# Patient Record
Sex: Male | Born: 1935 | Race: White | Hispanic: No | Smoking: Never smoker
Health system: Southern US, Community
[De-identification: ages and names within clinical notes are randomized; demographics above are authoritative.]

## PROBLEM LIST (undated history)

## (undated) DIAGNOSIS — C801 Malignant (primary) neoplasm, unspecified: Secondary | ICD-10-CM

## (undated) DIAGNOSIS — E7800 Pure hypercholesterolemia, unspecified: Secondary | ICD-10-CM

## (undated) DIAGNOSIS — E079 Disorder of thyroid, unspecified: Secondary | ICD-10-CM

## (undated) DIAGNOSIS — K449 Diaphragmatic hernia without obstruction or gangrene: Secondary | ICD-10-CM

## (undated) DIAGNOSIS — K219 Gastro-esophageal reflux disease without esophagitis: Secondary | ICD-10-CM

## (undated) DIAGNOSIS — K922 Gastrointestinal hemorrhage, unspecified: Secondary | ICD-10-CM

## (undated) DIAGNOSIS — M810 Age-related osteoporosis without current pathological fracture: Secondary | ICD-10-CM

## (undated) HISTORY — PX: TONSILLECTOMY: SUR1361

## (undated) HISTORY — PX: APPENDECTOMY: SHX54

---

## 2000-01-22 ENCOUNTER — Other Ambulatory Visit: Admission: RE | Admit: 2000-01-22 | Discharge: 2000-01-22 | Payer: Self-pay | Admitting: Urology

## 2000-02-21 ENCOUNTER — Encounter: Admission: RE | Admit: 2000-02-21 | Discharge: 2000-05-21 | Payer: Self-pay | Admitting: Radiation Oncology

## 2000-03-13 ENCOUNTER — Ambulatory Visit (HOSPITAL_COMMUNITY): Admission: RE | Admit: 2000-03-13 | Discharge: 2000-03-13 | Payer: Self-pay | Admitting: Gastroenterology

## 2001-07-03 ENCOUNTER — Ambulatory Visit (HOSPITAL_COMMUNITY): Admission: RE | Admit: 2001-07-03 | Discharge: 2001-07-03 | Payer: Self-pay | Admitting: Gastroenterology

## 2006-02-08 ENCOUNTER — Emergency Department (HOSPITAL_COMMUNITY): Admission: EM | Admit: 2006-02-08 | Discharge: 2006-02-08 | Payer: Self-pay | Admitting: Family Medicine

## 2006-12-06 ENCOUNTER — Encounter: Admission: RE | Admit: 2006-12-06 | Discharge: 2006-12-06 | Payer: Self-pay | Admitting: Family Medicine

## 2007-07-31 ENCOUNTER — Encounter (INDEPENDENT_AMBULATORY_CARE_PROVIDER_SITE_OTHER): Payer: Self-pay | Admitting: Family Medicine

## 2007-07-31 ENCOUNTER — Ambulatory Visit (HOSPITAL_COMMUNITY): Admission: RE | Admit: 2007-07-31 | Discharge: 2007-07-31 | Payer: Self-pay | Admitting: Family Medicine

## 2007-07-31 ENCOUNTER — Ambulatory Visit: Payer: Self-pay | Admitting: Vascular Surgery

## 2010-06-30 ENCOUNTER — Inpatient Hospital Stay (HOSPITAL_COMMUNITY): Admission: EM | Admit: 2010-06-30 | Discharge: 2010-07-03 | Payer: Self-pay | Admitting: Emergency Medicine

## 2011-01-11 LAB — COMPREHENSIVE METABOLIC PANEL
ALT: 14 U/L (ref 0–53)
AST: 21 U/L (ref 0–37)
CO2: 25 mEq/L (ref 19–32)
Calcium: 8.9 mg/dL (ref 8.4–10.5)
Chloride: 110 mEq/L (ref 96–112)
GFR calc non Af Amer: >60 mL/min (ref >60)
Glucose, Bld: 112 mg/dL — ABNORMAL HIGH (ref 70–99)
Sodium: 140 mEq/L (ref 135–145)
Total Bilirubin: 0.9 mg/dL (ref 0.3–1.2)

## 2011-01-11 LAB — CBC
HCT: 33.1 % — ABNORMAL LOW (ref 39.0–52.0)
HCT: 34.5 % — ABNORMAL LOW (ref 39.0–52.0)
HCT: 36 % — ABNORMAL LOW (ref 39.0–52.0)
HCT: 36.5 % — ABNORMAL LOW (ref 39.0–52.0)
HCT: 38 % — ABNORMAL LOW (ref 39.0–52.0)
HCT: 40.9 % (ref 39.0–52.0)
Hemoglobin: 11.4 g/dL — ABNORMAL LOW (ref 13.0–17.0)
Hemoglobin: 11.7 g/dL — ABNORMAL LOW (ref 13.0–17.0)
Hemoglobin: 11.9 g/dL — ABNORMAL LOW (ref 13.0–17.0)
Hemoglobin: 12.4 g/dL — ABNORMAL LOW (ref 13.0–17.0)
Hemoglobin: 12.5 g/dL — ABNORMAL LOW (ref 13.0–17.0)
Hemoglobin: 14.1 g/dL (ref 13.0–17.0)
MCH: 32.8 pg (ref 26.0–34.0)
MCH: 32.8 pg (ref 26.0–34.0)
MCH: 32.9 pg (ref 26.0–34.0)
MCH: 32.9 pg (ref 26.0–34.0)
MCH: 32.9 pg (ref 26.0–34.0)
MCH: 33 pg (ref 26.0–34.0)
MCH: 33.1 pg (ref 26.0–34.0)
MCHC: 34 g/dL (ref 30.0–36.0)
MCHC: 34.3 g/dL (ref 30.0–36.0)
MCHC: 34.3 g/dL (ref 30.0–36.0)
MCHC: 34.5 g/dL (ref 30.0–36.0)
MCV: 95.4 fL (ref 78.0–100.0)
MCV: 95.8 fL (ref 78.0–100.0)
MCV: 96 fL (ref 78.0–100.0)
MCV: 96.6 fL (ref 78.0–100.0)
Platelets: 201 10*3/uL (ref 150–400)
Platelets: 204 10*3/uL (ref 150–400)
Platelets: 205 10*3/uL (ref 150–400)
Platelets: 213 10*3/uL (ref 150–400)
Platelets: 216 10*3/uL (ref 150–400)
Platelets: 217 10*3/uL (ref 150–400)
RBC: 3.44 MIL/uL — ABNORMAL LOW (ref 4.22–5.81)
RBC: 3.55 MIL/uL — ABNORMAL LOW (ref 4.22–5.81)
RBC: 3.57 MIL/uL — ABNORMAL LOW (ref 4.22–5.81)
RBC: 3.76 MIL/uL — ABNORMAL LOW (ref 4.22–5.81)
RBC: 3.77 MIL/uL — ABNORMAL LOW (ref 4.22–5.81)
RBC: 4.28 MIL/uL (ref 4.22–5.81)
RDW: 12.6 % (ref 11.5–15.5)
RDW: 12.7 % (ref 11.5–15.5)
RDW: 12.7 % (ref 11.5–15.5)
RDW: 12.8 % (ref 11.5–15.5)
RDW: 12.8 % (ref 11.5–15.5)
RDW: 12.8 % (ref 11.5–15.5)
WBC: 6.6 10*3/uL (ref 4.0–10.5)
WBC: 6.7 10*3/uL (ref 4.0–10.5)
WBC: 7.4 10*3/uL (ref 4.0–10.5)
WBC: 8.5 10*3/uL (ref 4.0–10.5)
WBC: 8.8 10*3/uL (ref 4.0–10.5)

## 2011-01-11 LAB — DIFFERENTIAL
Basophils Absolute: 0 10*3/uL (ref 0.0–0.1)
Basophils Relative: 0 % (ref 0–1)
Eosinophils Absolute: 0 10*3/uL (ref 0.0–0.7)
Eosinophils Relative: 0 % (ref 0–5)
Lymphs Abs: 0.8 10*3/uL (ref 0.7–4.0)
Neutrophils Relative %: 81 % — ABNORMAL HIGH (ref 43–77)

## 2011-01-11 LAB — URINALYSIS, ROUTINE W REFLEX MICROSCOPIC
Bilirubin Urine: NEGATIVE
Glucose, UA: NEGATIVE mg/dL
Hgb urine dipstick: NEGATIVE
Ketones, ur: NEGATIVE mg/dL
Nitrite: NEGATIVE
Protein, ur: NEGATIVE mg/dL
Specific Gravity, Urine: 1.012 (ref 1.005–1.030)
Urobilinogen, UA: 1 mg/dL (ref 0.0–1.0)
pH: 7 (ref 5.0–8.0)

## 2011-01-11 LAB — URINE CULTURE
Colony Count: NO GROWTH
Culture  Setup Time: 201109021548
Culture: NO GROWTH

## 2011-01-11 LAB — PROTIME-INR: Prothrombin Time: 13.8 seconds (ref 11.6–15.2)

## 2011-01-11 LAB — BASIC METABOLIC PANEL
BUN: 12 mg/dL (ref 6–23)
CO2: 26 mEq/L (ref 19–32)
Chloride: 110 mEq/L (ref 96–112)
Creatinine, Ser: 0.82 mg/dL (ref 0.4–1.5)
Potassium: 3.9 mEq/L (ref 3.5–5.1)

## 2011-01-11 LAB — LIPASE, BLOOD: Lipase: 22 U/L (ref 11–59)

## 2011-01-11 LAB — HEMOCCULT GUIAC POC 1CARD (OFFICE): Fecal Occult Bld: POSITIVE

## 2011-03-16 NOTE — Procedures (Signed)
[facility]  Patient:    Kevin Carr, Kevin Carr                      MRN: [id number] Proc. Date: 03/13/00 Adm. Date:  [id number] Disc. Date: [id number] Attending:  Nelda Marseille CC:         Petra Kuba, M.D.             Oley Balm Georgina Pillion, M.D.             Maryln Gottron, M.D.             Rozanna Boer., M.D.                           Procedure Report  PROCEDURE PERFORMED:  Colonoscopy.  ENDOSCOPIST:  Petra Kuba, M.D.  INDICATIONS FOR PROCEDURE:  Guaiac positivity in a patient with a recent diagnosis of prostate cancer about to begin radiation therapy.  Consent was signed after risks, benefits, methods, and options were thoroughly discussed.  MEDICATIONS USED:  Demerol 50 mg, Versed 4 mg.  DESCRIPTION OF PROCEDURE:  Rectal inspection was pertinent for small external hemorrhoids.  Digital exam was negative.  Video colonoscope was inserted and fairly easily advanced around the colon to the cecum.  This did require some abdominal pressure but no position changes.  On insertion, both left and right diverticula were seen but no other abnormalities.  The cecum was identified by the appendiceal orifice and ileocecal valve.  In fact the scope was inserted a short ways into the terminal ileum which was normal.  Photodocumentation was obtained and the scope was slowly withdrawn.  Prep was adequate.  There was minimal liquid stool that required suctioning only.  On slow withdrawal through the colon, other than the diverticula mentioned above, no abnormalities were seen.  No blood was seen.  Once back in the rectum, the scope was retroflexed pertinent for some small internal hemorrhoids.  Scope was straightened and readvanced a short ways up the sigmoid.  Air was suctioned and scope removed.  The patient tolerated the procedure well.  There was no obvious immediate complication.  ENDOSCOPIC DIAGNOSIS: 1. Internal and external hemorrhoids. 2.  Diverticula throughout. 3. No blood seen in exam to the terminal ileum.  Otherwise within normal    limits to the terminal ileum.  PLAN:  Follow-up p.r.n. or in six to eight weeks to recheck symptoms, guaiac to make sure a one time upper GI small bowel follow-through is not needed to complete his work-up.  Happy to see back sooner p.r.n. and will recommend repeat screening in five years. DD:  03/13/00 TD:  03/15/00 Job: [id number]

## 2012-10-27 ENCOUNTER — Encounter (HOSPITAL_COMMUNITY): Payer: Self-pay | Admitting: *Deleted

## 2012-10-27 ENCOUNTER — Emergency Department (HOSPITAL_COMMUNITY): Payer: Medicare Other

## 2012-10-27 ENCOUNTER — Ambulatory Visit (HOSPITAL_COMMUNITY): Payer: Medicare Other

## 2012-10-27 ENCOUNTER — Emergency Department (HOSPITAL_COMMUNITY)
Admission: EM | Admit: 2012-10-27 | Discharge: 2012-10-27 | Disposition: A | Discharge status: Home / Self Care | Payer: Medicare Other | Attending: Emergency Medicine | Admitting: Emergency Medicine

## 2012-10-27 DIAGNOSIS — R1110 Vomiting, unspecified: Secondary | ICD-10-CM | POA: Insufficient documentation

## 2012-10-27 DIAGNOSIS — Z8546 Personal history of malignant neoplasm of prostate: Secondary | ICD-10-CM | POA: Insufficient documentation

## 2012-10-27 DIAGNOSIS — N200 Calculus of kidney: Secondary | ICD-10-CM | POA: Insufficient documentation

## 2012-10-27 DIAGNOSIS — Z8719 Personal history of other diseases of the digestive system: Secondary | ICD-10-CM | POA: Insufficient documentation

## 2012-10-27 DIAGNOSIS — K5900 Constipation, unspecified: Secondary | ICD-10-CM | POA: Insufficient documentation

## 2012-10-27 HISTORY — DX: Malignant (primary) neoplasm, unspecified: C80.1

## 2012-10-27 HISTORY — DX: Gastrointestinal hemorrhage, unspecified: K92.2

## 2012-10-27 LAB — URINALYSIS, ROUTINE W REFLEX MICROSCOPIC
Bilirubin Urine: NEGATIVE
Glucose, UA: NEGATIVE mg/dL
Ketones, ur: NEGATIVE mg/dL
Nitrite: NEGATIVE
Protein, ur: NEGATIVE mg/dL
pH: 6 (ref 5.0–8.0)

## 2012-10-27 LAB — CBC WITH DIFFERENTIAL/PLATELET
Basophils Absolute: 0 10*3/uL (ref 0.0–0.1)
Eosinophils Relative: 0 % (ref 0–5)
HCT: 44.2 % (ref 39.0–52.0)
Lymphocytes Relative: 4 % — ABNORMAL LOW (ref 12–46)
Lymphs Abs: 0.6 10*3/uL — ABNORMAL LOW (ref 0.7–4.0)
MCH: 32.4 pg (ref 26.0–34.0)
MCV: 92.9 fL (ref 78.0–100.0)
Monocytes Absolute: 0.7 10*3/uL (ref 0.1–1.0)
RDW: 13 % (ref 11.5–15.5)
WBC: 13.9 10*3/uL — ABNORMAL HIGH (ref 4.0–10.5)

## 2012-10-27 LAB — COMPREHENSIVE METABOLIC PANEL
CO2: 24 mEq/L (ref 19–32)
Calcium: 9.5 mg/dL (ref 8.4–10.5)
Creatinine, Ser: 1.04 mg/dL (ref 0.50–1.35)
GFR calc Af Amer: 74 mL/min — ABNORMAL LOW (ref >90)
GFR calc non Af Amer: 64 mL/min — ABNORMAL LOW (ref >90)
Glucose, Bld: 126 mg/dL — ABNORMAL HIGH (ref 70–99)

## 2012-10-27 LAB — LIPASE, BLOOD: Lipase: 21 U/L (ref 11–59)

## 2012-10-27 MED ORDER — SODIUM CHLORIDE 0.9 % IV BOLUS (SEPSIS)
1000.0000 mL | Freq: Once | INTRAVENOUS | Status: AC
  Administered 2012-10-27: 1000 mL via INTRAVENOUS

## 2012-10-27 MED ORDER — TAMSULOSIN HCL 0.4 MG PO CAPS: 0.4000 mg | ORAL_CAPSULE | Freq: Every day | ORAL | Status: AC

## 2012-10-27 MED ORDER — IBUPROFEN 600 MG PO TABS: 600.0000 mg | ORAL_TABLET | Freq: Four times a day (QID) | ORAL | Status: DC | PRN

## 2012-10-27 MED ORDER — HYDROMORPHONE HCL PF 1 MG/ML IJ SOLN
0.5000 mg | Freq: Once | INTRAMUSCULAR | Status: DC
  Filled 2012-10-27: qty 1

## 2012-10-27 MED ORDER — ONDANSETRON HCL 4 MG/2ML IJ SOLN
4.0000 mg | Freq: Once | INTRAMUSCULAR | Status: AC
  Administered 2012-10-27: 4 mg via INTRAVENOUS

## 2012-10-27 MED ORDER — OXYCODONE-ACETAMINOPHEN 5-325 MG PO TABS: 2.0000 | ORAL_TABLET | ORAL | Status: DC | PRN

## 2012-10-27 MED ORDER — IOHEXOL 300 MG/ML  SOLN
100.0000 mL | Freq: Once | INTRAMUSCULAR | Status: AC | PRN
  Administered 2012-10-27: 100 mL via INTRAVENOUS

## 2012-10-27 MED ORDER — ONDANSETRON HCL 4 MG/2ML IJ SOLN
INTRAMUSCULAR | Status: AC
  Filled 2012-10-27: qty 2

## 2012-10-27 MED ORDER — MORPHINE SULFATE 4 MG/ML IJ SOLN
4.0000 mg | Freq: Once | INTRAMUSCULAR | Status: AC
  Administered 2012-10-27: 4 mg via INTRAVENOUS
  Filled 2012-10-27: qty 1

## 2012-10-27 MED ORDER — FLEET ENEMA 7-19 GM/118ML RE ENEM
1.0000 | ENEMA | Freq: Once | RECTAL | Status: AC
  Administered 2012-10-27: 1 via RECTAL
  Filled 2012-10-27: qty 1

## 2012-10-27 NOTE — ED Notes (Signed)
Went in to medicate pt as ordered for pain, pt states he feels much better.  Rates pain 2/10.  Dr. Silverio Lay notified

## 2012-10-27 NOTE — ED Notes (Signed)
Pt states since 3 am this morning he's been having lower abdominal pain, states been constipated, when asked when last BM was states "I forced myself to have a small one this morning but before then it's been a couple days". Pt states drank a coke this morning and after vomited x 2.

## 2012-10-27 NOTE — ED Notes (Signed)
Pt drinking PO contrast at this time.

## 2012-10-27 NOTE — ED Provider Notes (Signed)
History     CSN: [id number]  Arrival date & time 10/27/12  0272   First MD Initiated Contact with Patient 10/27/12 [phone]      Chief Complaint  Patient presents with  . Abdominal Pain  . Constipation    (Consider location/radiation/quality/duration/timing/severity/associated sxs/prior treatment) The history is provided by the patient.  Kevin Carr is a 78 y.o. male history of GI bleed here with constipation. Constipated for the last to 3 days. This morning woke up with some lower abdominal pain, that is crampy and nonradiating. He strained and had a small bowel movement this morning. He also drinks him Coca-Cola and felt better initially but then vomited x2. He had a history of appendectomy in the past but no history of obstruction.   Past Medical History  Diagnosis Date  . GI bleed   . Cancer     prostate    Past Surgical History  Procedure Date  . Tonsillectomy   . Appendectomy     History reviewed. No pertinent family history.  History  Substance Use Topics  . Smoking status: Never Smoker   . Smokeless tobacco: Never Used  . Alcohol Use: No      Review of Systems  Gastrointestinal: Positive for vomiting, abdominal pain and constipation.  All other systems reviewed and are negative.    Allergies  Review of patient's allergies indicates no known allergies.  Home Medications   Current Outpatient Rx  Name  Route  Sig  Dispense  Refill  . ASPIRIN 81 MG PO TABS   Oral   Take 81 mg by mouth daily.         Marland Kitchen FINASTERIDE 5 MG PO TABS   Oral   Take 5 mg by mouth daily.         Marland Kitchen LEVOTHYROXINE SODIUM 112 MCG PO TABS   Oral   Take 112 mcg by mouth daily.         . MULTI-VITAMIN/MINERALS PO TABS   Oral   Take 1 tablet by mouth daily.         Marland Kitchen OMEPRAZOLE 20 MG PO CPDR   Oral   Take 20 mg by mouth daily.         Marland Kitchen TAMSULOSIN HCL 0.4 MG PO CAPS   Oral   Take by mouth.           BP 157/60  Pulse 80  Temp 97.7 F (36.5 C) (Oral)   Resp 16  SpO2 96%  Physical Exam  Nursing note and vitals reviewed. Constitutional: He is oriented to person, place, and time. He appears well-developed and well-nourished.       Uncomfortable   HENT:  Head: Normocephalic.  Mouth/Throat: Oropharynx is clear and moist.  Eyes: Conjunctivae normal are normal. Pupils are equal, round, and reactive to light.  Neck: Normal range of motion. Neck supple.  Cardiovascular: Normal rate, regular rhythm and normal heart sounds.   Pulmonary/Chest: Effort normal and breath sounds normal. No respiratory distress. He has no wheezes. He has no rales.  Abdominal: Soft.       + distended, + mild diffuse tenderness, worse in LLQ. No rebound. + BS. No CVAT. Rectal- no hemorrhoids, brown stool, not impacted.   Musculoskeletal: Normal range of motion. He exhibits no edema and no tenderness.  Neurological: He is alert and oriented to person, place, and time.  Skin: Skin is warm and dry.  Psychiatric: He has a normal mood and affect. His behavior is normal. Judgment  and thought content normal.    ED Course  Procedures (including critical care time)  Labs Reviewed  CBC WITH DIFFERENTIAL - Abnormal; Notable for the following:    WBC 13.9 (*)     Neutrophils Relative 91 (*)     Neutro Abs 12.6 (*)     Lymphocytes Relative 4 (*)     Lymphs Abs 0.6 (*)     All other components within normal limits  COMPREHENSIVE METABOLIC PANEL - Abnormal; Notable for the following:    Glucose, Bld 126 (*)     GFR calc non Af Amer 64 (*)     GFR calc Af Amer 74 (*)     All other components within normal limits  LIPASE, BLOOD  URINALYSIS, ROUTINE W REFLEX MICROSCOPIC   Ct Abdomen Pelvis W Contrast  10/27/2012  *RADIOLOGY REPORT*  Clinical Data: Abdominal pain  CT ABDOMEN AND PELVIS WITH CONTRAST  Technique:  Multidetector CT imaging of the abdomen and pelvis was performed following the standard protocol during bolus administration of intravenous contrast.  Contrast:  OMNIPAQUE IOHEXOL 300 MG/ML  SOLN  Comparison: June 30, 2010  Findings: A large hiatal hernia contains most of the stomach. Contrast in an air-fluid level is present in the stomach.  But it has progressed into the jejunum.  No disproportionate dilatation of small or large bowel.  Additional imaging through the thorax was performed to delineate the hiatal hernia.  Coronary artery calcifications are present. Contrast is present throughout the esophagus. This may be due to reflux.  There is associated volume loss in the left lower lobe.  Liver contains calcified granulomata but no solid mass. Gallbladder, spleen, pancreas, adrenal glands are within normal limits.  There is a large simple cyst in the lower pole of the right kidney. Several smaller hypodensities are likely cysts.  The bladder is lobulated and distended.  6 mm calculus at the left ureteral vesicle junction.  There is dilatation of the left ureter but no delayed contrast excretion from the left kidney.  Prostate is normal in size.  No free fluid.  No obvious abnormal adenopathy.  Circumaortic left renal vein anatomy.  Extensive diverticulosis of the colon without evidence of acute diverticulitis.  L4 compression fracture with proximally 30% loss of height. Superior end plate depression is associated.  Age is indeterminate.  IMPRESSION: Large hiatal hernia.  No definite evidence of obstruction. Contrast in the esophagus may simply represent reflux.  6 mm distal left ureteral calculus with dilatation of the ureter. No delayed excretion of contrast from the left kidney.  Diverticulosis of the colon without evidence of diverticulitis.  L4 compression fracture of indeterminate age.   Original Report Authenticated By: Jolaine Click, M.D.    Dg Abd Acute W/chest  10/27/2012  *RADIOLOGY REPORT*  Clinical Data: Abdominal pain and constipation.  ACUTE ABDOMEN SERIES (ABDOMEN 2 VIEW & CHEST 1 VIEW)  Comparison: Abdomen CT from 06/30/2010  Findings: Upright  chest x-ray shows no edema or focal airspace consolidation. Interstitial markings are diffusely coarsened with chronic features. Cardiopericardial silhouette is at upper limits of normal for size.  Large hiatal hernia noted.  Upright film shows no evidence for intraperitoneal free air. Supine film shows no gaseous bowel dilatation to suggest obstruction.  Prominent stool noted in the right colon with average stool volume seen in the left and distal colon.  Bones are diffusely demineralized.  IMPRESSION: No acute cardiopulmonary findings.  Large hiatal hernia.  Prominent stool in the right colon without evidence  for bowel obstruction.   Original Report Authenticated By: Kennith Center, M.D.      No diagnosis found.    MDM  Kevin Carr is a 78 y.o. male here with constipation and ab pain. Ab pain likely from constipation but given hx of appendectomy, will get xray to r/o SBO.   10:20 AM  Xrays showed no SBO. Patient had enema with minimal improvement. Will put in IV and give pain meds and do CT ab/pel to r/o partial SBO.   2:11 PM Patient felt better after pain meds. CT showed large hiatal hernia without obstruction and 6 mm L UVJ stone. I think his pain is likely from kidney stone. Will d/c home with percocet, motrin, and flomax. He has a urologist that he can see.      Richardean Canal, MD 10/27/12 [phone]

## 2012-10-27 NOTE — ED Notes (Signed)
Pt reports lower abd pain early this am.  Pt reports LBM x 3 days ago but reports having a small one this am.

## 2012-10-27 NOTE — ED Notes (Signed)
Pt used BR after enema was administered-small BM noted.  Dr. Silverio Lay notified

## 2012-10-27 NOTE — ED Notes (Signed)
Pt ambulated to the BR with steady gait without assist 

## 2012-10-27 NOTE — ED Notes (Signed)
Patient transported to CT 

## 2015-08-16 ENCOUNTER — Other Ambulatory Visit: Payer: Self-pay | Admitting: Family Medicine

## 2015-08-16 ENCOUNTER — Ambulatory Visit
Admission: RE | Admit: 2015-08-16 | Discharge: 2015-08-16 | Disposition: A | Discharge status: Home / Self Care | Payer: Medicare Other | Source: Ambulatory Visit | Attending: Family Medicine | Admitting: Family Medicine

## 2015-08-16 DIAGNOSIS — T148XXA Other injury of unspecified body region, initial encounter: Secondary | ICD-10-CM

## 2015-08-16 DIAGNOSIS — M545 Low back pain: Secondary | ICD-10-CM

## 2015-08-22 ENCOUNTER — Other Ambulatory Visit: Payer: Self-pay | Admitting: Family Medicine

## 2015-08-22 DIAGNOSIS — S32000S Wedge compression fracture of unspecified lumbar vertebra, sequela: Secondary | ICD-10-CM

## 2015-09-21 ENCOUNTER — Ambulatory Visit
Admission: RE | Admit: 2015-09-21 | Discharge: 2015-09-21 | Disposition: A | Discharge status: Home / Self Care | Payer: Medicare Other | Source: Ambulatory Visit | Attending: Family Medicine | Admitting: Family Medicine

## 2015-09-21 DIAGNOSIS — S32000S Wedge compression fracture of unspecified lumbar vertebra, sequela: Secondary | ICD-10-CM

## 2015-10-18 ENCOUNTER — Other Ambulatory Visit: Payer: Self-pay | Admitting: Family Medicine

## 2015-10-18 ENCOUNTER — Ambulatory Visit
Admission: RE | Admit: 2015-10-18 | Discharge: 2015-10-18 | Disposition: A | Discharge status: Home / Self Care | Payer: Medicare Other | Source: Ambulatory Visit | Attending: Family Medicine | Admitting: Family Medicine

## 2015-10-18 DIAGNOSIS — R05 Cough: Secondary | ICD-10-CM

## 2015-10-18 DIAGNOSIS — R059 Cough, unspecified: Secondary | ICD-10-CM

## 2016-07-16 ENCOUNTER — Ambulatory Visit
Admission: RE | Admit: 2016-07-16 | Discharge: 2016-07-16 | Disposition: A | Discharge status: Home / Self Care | Payer: Medicare Other | Source: Ambulatory Visit | Attending: Family Medicine | Admitting: Family Medicine

## 2016-07-16 ENCOUNTER — Other Ambulatory Visit: Payer: Self-pay | Admitting: Family Medicine

## 2016-07-16 DIAGNOSIS — J42 Unspecified chronic bronchitis: Secondary | ICD-10-CM

## 2016-11-22 DIAGNOSIS — Z0100 Encounter for examination of eyes and vision without abnormal findings: Secondary | ICD-10-CM | POA: Diagnosis not present

## 2016-11-22 DIAGNOSIS — H52223 Regular astigmatism, bilateral: Secondary | ICD-10-CM | POA: Diagnosis not present

## 2017-03-28 DIAGNOSIS — Z7983 Long term (current) use of bisphosphonates: Secondary | ICD-10-CM | POA: Diagnosis not present

## 2017-03-28 DIAGNOSIS — Z79899 Other long term (current) drug therapy: Secondary | ICD-10-CM | POA: Diagnosis not present

## 2017-03-28 DIAGNOSIS — R339 Retention of urine, unspecified: Secondary | ICD-10-CM | POA: Diagnosis not present

## 2017-03-28 DIAGNOSIS — E039 Hypothyroidism, unspecified: Secondary | ICD-10-CM | POA: Diagnosis not present

## 2017-03-28 DIAGNOSIS — Z923 Personal history of irradiation: Secondary | ICD-10-CM | POA: Diagnosis not present

## 2017-03-28 DIAGNOSIS — Z8546 Personal history of malignant neoplasm of prostate: Secondary | ICD-10-CM | POA: Diagnosis not present

## 2017-03-28 DIAGNOSIS — M810 Age-related osteoporosis without current pathological fracture: Secondary | ICD-10-CM | POA: Diagnosis not present

## 2017-03-28 DIAGNOSIS — Z6825 Body mass index (BMI) 25.0-25.9, adult: Secondary | ICD-10-CM | POA: Diagnosis not present

## 2017-03-28 DIAGNOSIS — Z0000 Encounter for general adult medical examination without abnormal findings: Secondary | ICD-10-CM | POA: Diagnosis not present

## 2017-03-28 DIAGNOSIS — K219 Gastro-esophageal reflux disease without esophagitis: Secondary | ICD-10-CM | POA: Diagnosis not present

## 2017-08-06 DIAGNOSIS — R69 Illness, unspecified: Secondary | ICD-10-CM | POA: Diagnosis not present

## 2017-10-18 DIAGNOSIS — Z0001 Encounter for general adult medical examination with abnormal findings: Secondary | ICD-10-CM | POA: Diagnosis not present

## 2017-10-18 DIAGNOSIS — Z79899 Other long term (current) drug therapy: Secondary | ICD-10-CM | POA: Diagnosis not present

## 2017-10-18 DIAGNOSIS — K219 Gastro-esophageal reflux disease without esophagitis: Secondary | ICD-10-CM | POA: Diagnosis not present

## 2017-10-18 DIAGNOSIS — E7800 Pure hypercholesterolemia, unspecified: Secondary | ICD-10-CM | POA: Diagnosis not present

## 2017-10-18 DIAGNOSIS — E039 Hypothyroidism, unspecified: Secondary | ICD-10-CM | POA: Diagnosis not present

## 2017-10-18 DIAGNOSIS — M818 Other osteoporosis without current pathological fracture: Secondary | ICD-10-CM | POA: Diagnosis not present

## 2017-10-18 DIAGNOSIS — Z23 Encounter for immunization: Secondary | ICD-10-CM | POA: Diagnosis not present

## 2017-10-24 ENCOUNTER — Other Ambulatory Visit: Payer: Self-pay | Admitting: Family Medicine

## 2017-10-24 DIAGNOSIS — M818 Other osteoporosis without current pathological fracture: Secondary | ICD-10-CM

## 2017-11-06 DIAGNOSIS — R269 Unspecified abnormalities of gait and mobility: Secondary | ICD-10-CM | POA: Diagnosis not present

## 2017-11-06 DIAGNOSIS — Z8546 Personal history of malignant neoplasm of prostate: Secondary | ICD-10-CM | POA: Diagnosis not present

## 2017-11-15 ENCOUNTER — Other Ambulatory Visit: Payer: Medicare Other

## 2017-11-15 ENCOUNTER — Ambulatory Visit
Admission: RE | Admit: 2017-11-15 | Discharge: 2017-11-15 | Disposition: A | Discharge status: Home / Self Care | Payer: Medicare HMO | Source: Ambulatory Visit | Attending: Family Medicine | Admitting: Family Medicine

## 2017-11-15 DIAGNOSIS — M818 Other osteoporosis without current pathological fracture: Secondary | ICD-10-CM

## 2017-11-15 DIAGNOSIS — M810 Age-related osteoporosis without current pathological fracture: Secondary | ICD-10-CM | POA: Diagnosis not present

## 2018-01-07 DIAGNOSIS — I872 Venous insufficiency (chronic) (peripheral): Secondary | ICD-10-CM | POA: Diagnosis not present

## 2018-01-07 DIAGNOSIS — K219 Gastro-esophageal reflux disease without esophagitis: Secondary | ICD-10-CM | POA: Diagnosis not present

## 2018-01-07 DIAGNOSIS — Z79899 Other long term (current) drug therapy: Secondary | ICD-10-CM | POA: Diagnosis not present

## 2018-01-07 DIAGNOSIS — N200 Calculus of kidney: Secondary | ICD-10-CM | POA: Diagnosis not present

## 2018-08-01 DIAGNOSIS — L570 Actinic keratosis: Secondary | ICD-10-CM | POA: Diagnosis not present

## 2018-08-01 DIAGNOSIS — X32XXXD Exposure to sunlight, subsequent encounter: Secondary | ICD-10-CM | POA: Diagnosis not present

## 2018-08-01 DIAGNOSIS — C44329 Squamous cell carcinoma of skin of other parts of face: Secondary | ICD-10-CM | POA: Diagnosis not present

## 2018-08-15 DIAGNOSIS — Z85828 Personal history of other malignant neoplasm of skin: Secondary | ICD-10-CM | POA: Diagnosis not present

## 2018-08-15 DIAGNOSIS — Z08 Encounter for follow-up examination after completed treatment for malignant neoplasm: Secondary | ICD-10-CM | POA: Diagnosis not present

## 2018-08-29 DIAGNOSIS — D485 Neoplasm of uncertain behavior of skin: Secondary | ICD-10-CM | POA: Diagnosis not present

## 2018-08-29 DIAGNOSIS — L929 Granulomatous disorder of the skin and subcutaneous tissue, unspecified: Secondary | ICD-10-CM | POA: Diagnosis not present

## 2018-08-29 DIAGNOSIS — R69 Illness, unspecified: Secondary | ICD-10-CM | POA: Diagnosis not present

## 2018-09-03 DIAGNOSIS — S39011A Strain of muscle, fascia and tendon of abdomen, initial encounter: Secondary | ICD-10-CM | POA: Diagnosis not present

## 2018-09-08 DIAGNOSIS — R1013 Epigastric pain: Secondary | ICD-10-CM | POA: Diagnosis not present

## 2018-09-08 DIAGNOSIS — K469 Unspecified abdominal hernia without obstruction or gangrene: Secondary | ICD-10-CM | POA: Diagnosis not present

## 2018-09-09 ENCOUNTER — Other Ambulatory Visit: Payer: Self-pay | Admitting: Family Medicine

## 2018-09-09 ENCOUNTER — Ambulatory Visit
Admission: RE | Admit: 2018-09-09 | Discharge: 2018-09-09 | Disposition: A | Discharge status: Home / Self Care | Payer: Medicare HMO | Source: Ambulatory Visit | Attending: Family Medicine | Admitting: Family Medicine

## 2018-09-09 DIAGNOSIS — R1013 Epigastric pain: Secondary | ICD-10-CM

## 2018-09-09 DIAGNOSIS — N281 Cyst of kidney, acquired: Secondary | ICD-10-CM | POA: Diagnosis not present

## 2018-09-09 DIAGNOSIS — K5730 Diverticulosis of large intestine without perforation or abscess without bleeding: Secondary | ICD-10-CM | POA: Diagnosis not present

## 2018-09-09 MED ORDER — IOPAMIDOL (ISOVUE-300) INJECTION 61%
100.0000 mL | Freq: Once | INTRAVENOUS | Status: AC | PRN
  Administered 2018-09-09: 100 mL via INTRAVENOUS

## 2018-09-17 DIAGNOSIS — K449 Diaphragmatic hernia without obstruction or gangrene: Secondary | ICD-10-CM | POA: Diagnosis not present

## 2018-09-17 DIAGNOSIS — K219 Gastro-esophageal reflux disease without esophagitis: Secondary | ICD-10-CM | POA: Diagnosis not present

## 2018-09-17 DIAGNOSIS — R1013 Epigastric pain: Secondary | ICD-10-CM | POA: Diagnosis not present

## 2018-10-21 DIAGNOSIS — Z0001 Encounter for general adult medical examination with abnormal findings: Secondary | ICD-10-CM | POA: Diagnosis not present

## 2018-10-21 DIAGNOSIS — Z79899 Other long term (current) drug therapy: Secondary | ICD-10-CM | POA: Diagnosis not present

## 2018-10-21 DIAGNOSIS — K449 Diaphragmatic hernia without obstruction or gangrene: Secondary | ICD-10-CM | POA: Diagnosis not present

## 2018-10-21 DIAGNOSIS — M810 Age-related osteoporosis without current pathological fracture: Secondary | ICD-10-CM | POA: Diagnosis not present

## 2018-10-21 DIAGNOSIS — E039 Hypothyroidism, unspecified: Secondary | ICD-10-CM | POA: Diagnosis not present

## 2019-06-03 DIAGNOSIS — N4889 Other specified disorders of penis: Secondary | ICD-10-CM | POA: Diagnosis not present

## 2019-07-24 DIAGNOSIS — R69 Illness, unspecified: Secondary | ICD-10-CM | POA: Diagnosis not present

## 2019-09-30 DIAGNOSIS — E039 Hypothyroidism, unspecified: Secondary | ICD-10-CM | POA: Diagnosis not present

## 2019-09-30 DIAGNOSIS — Z803 Family history of malignant neoplasm of breast: Secondary | ICD-10-CM | POA: Diagnosis not present

## 2019-09-30 DIAGNOSIS — M810 Age-related osteoporosis without current pathological fracture: Secondary | ICD-10-CM | POA: Diagnosis not present

## 2019-09-30 DIAGNOSIS — Z6827 Body mass index (BMI) 27.0-27.9, adult: Secondary | ICD-10-CM | POA: Diagnosis not present

## 2019-09-30 DIAGNOSIS — E663 Overweight: Secondary | ICD-10-CM | POA: Diagnosis not present

## 2019-09-30 DIAGNOSIS — Z7983 Long term (current) use of bisphosphonates: Secondary | ICD-10-CM | POA: Diagnosis not present

## 2019-09-30 DIAGNOSIS — K219 Gastro-esophageal reflux disease without esophagitis: Secondary | ICD-10-CM | POA: Diagnosis not present

## 2019-09-30 DIAGNOSIS — G3184 Mild cognitive impairment, so stated: Secondary | ICD-10-CM | POA: Diagnosis not present

## 2019-09-30 DIAGNOSIS — N400 Enlarged prostate without lower urinary tract symptoms: Secondary | ICD-10-CM | POA: Diagnosis not present

## 2019-09-30 DIAGNOSIS — Z7982 Long term (current) use of aspirin: Secondary | ICD-10-CM | POA: Diagnosis not present

## 2019-11-17 DIAGNOSIS — E039 Hypothyroidism, unspecified: Secondary | ICD-10-CM | POA: Diagnosis not present

## 2019-11-17 DIAGNOSIS — L989 Disorder of the skin and subcutaneous tissue, unspecified: Secondary | ICD-10-CM | POA: Diagnosis not present

## 2019-11-17 DIAGNOSIS — Z79899 Other long term (current) drug therapy: Secondary | ICD-10-CM | POA: Diagnosis not present

## 2019-11-17 DIAGNOSIS — M25562 Pain in left knee: Secondary | ICD-10-CM | POA: Diagnosis not present

## 2019-11-25 DIAGNOSIS — X32XXXD Exposure to sunlight, subsequent encounter: Secondary | ICD-10-CM | POA: Diagnosis not present

## 2019-11-25 DIAGNOSIS — Z85828 Personal history of other malignant neoplasm of skin: Secondary | ICD-10-CM | POA: Diagnosis not present

## 2019-11-25 DIAGNOSIS — Z08 Encounter for follow-up examination after completed treatment for malignant neoplasm: Secondary | ICD-10-CM | POA: Diagnosis not present

## 2019-11-25 DIAGNOSIS — L570 Actinic keratosis: Secondary | ICD-10-CM | POA: Diagnosis not present

## 2019-12-10 DIAGNOSIS — Q791 Other congenital malformations of diaphragm: Secondary | ICD-10-CM | POA: Diagnosis not present

## 2019-12-10 DIAGNOSIS — R1084 Generalized abdominal pain: Secondary | ICD-10-CM | POA: Diagnosis not present

## 2019-12-10 DIAGNOSIS — K449 Diaphragmatic hernia without obstruction or gangrene: Secondary | ICD-10-CM | POA: Diagnosis not present

## 2019-12-19 ENCOUNTER — Other Ambulatory Visit: Payer: Self-pay | Admitting: Family Medicine

## 2019-12-19 DIAGNOSIS — K449 Diaphragmatic hernia without obstruction or gangrene: Secondary | ICD-10-CM

## 2019-12-19 DIAGNOSIS — Q791 Other congenital malformations of diaphragm: Secondary | ICD-10-CM

## 2019-12-19 DIAGNOSIS — R1084 Generalized abdominal pain: Secondary | ICD-10-CM

## 2019-12-22 DIAGNOSIS — R1013 Epigastric pain: Secondary | ICD-10-CM | POA: Diagnosis not present

## 2019-12-22 DIAGNOSIS — K219 Gastro-esophageal reflux disease without esophagitis: Secondary | ICD-10-CM | POA: Diagnosis not present

## 2019-12-22 DIAGNOSIS — K449 Diaphragmatic hernia without obstruction or gangrene: Secondary | ICD-10-CM | POA: Diagnosis not present

## 2019-12-30 ENCOUNTER — Other Ambulatory Visit: Payer: Self-pay

## 2019-12-30 ENCOUNTER — Ambulatory Visit
Admission: RE | Admit: 2019-12-30 | Discharge: 2019-12-30 | Disposition: A | Discharge status: Home / Self Care | Payer: Medicare HMO | Source: Ambulatory Visit | Attending: Family Medicine | Admitting: Family Medicine

## 2019-12-30 DIAGNOSIS — Q791 Other congenital malformations of diaphragm: Secondary | ICD-10-CM

## 2019-12-30 DIAGNOSIS — N281 Cyst of kidney, acquired: Secondary | ICD-10-CM | POA: Diagnosis not present

## 2019-12-30 DIAGNOSIS — K449 Diaphragmatic hernia without obstruction or gangrene: Secondary | ICD-10-CM

## 2019-12-30 DIAGNOSIS — R1084 Generalized abdominal pain: Secondary | ICD-10-CM

## 2019-12-30 MED ORDER — IOPAMIDOL (ISOVUE-300) INJECTION 61%
100.0000 mL | Freq: Once | INTRAVENOUS | Status: AC | PRN
  Administered 2019-12-30: 100 mL via INTRAVENOUS

## 2020-02-24 DIAGNOSIS — M818 Other osteoporosis without current pathological fracture: Secondary | ICD-10-CM | POA: Diagnosis not present

## 2020-02-24 DIAGNOSIS — E039 Hypothyroidism, unspecified: Secondary | ICD-10-CM | POA: Diagnosis not present

## 2020-02-24 DIAGNOSIS — Z79899 Other long term (current) drug therapy: Secondary | ICD-10-CM | POA: Diagnosis not present

## 2020-02-24 DIAGNOSIS — K219 Gastro-esophageal reflux disease without esophagitis: Secondary | ICD-10-CM | POA: Diagnosis not present

## 2020-02-24 DIAGNOSIS — Z0001 Encounter for general adult medical examination with abnormal findings: Secondary | ICD-10-CM | POA: Diagnosis not present

## 2020-02-24 DIAGNOSIS — E7800 Pure hypercholesterolemia, unspecified: Secondary | ICD-10-CM | POA: Diagnosis not present

## 2020-02-24 DIAGNOSIS — Q791 Other congenital malformations of diaphragm: Secondary | ICD-10-CM | POA: Diagnosis not present

## 2020-02-24 DIAGNOSIS — K449 Diaphragmatic hernia without obstruction or gangrene: Secondary | ICD-10-CM | POA: Diagnosis not present

## 2020-06-18 DIAGNOSIS — Z7982 Long term (current) use of aspirin: Secondary | ICD-10-CM | POA: Diagnosis not present

## 2020-06-18 DIAGNOSIS — E039 Hypothyroidism, unspecified: Secondary | ICD-10-CM | POA: Diagnosis not present

## 2020-06-18 DIAGNOSIS — N400 Enlarged prostate without lower urinary tract symptoms: Secondary | ICD-10-CM | POA: Diagnosis not present

## 2020-06-18 DIAGNOSIS — R32 Unspecified urinary incontinence: Secondary | ICD-10-CM | POA: Diagnosis not present

## 2020-06-18 DIAGNOSIS — Z803 Family history of malignant neoplasm of breast: Secondary | ICD-10-CM | POA: Diagnosis not present

## 2020-06-18 DIAGNOSIS — Z7722 Contact with and (suspected) exposure to environmental tobacco smoke (acute) (chronic): Secondary | ICD-10-CM | POA: Diagnosis not present

## 2020-06-18 DIAGNOSIS — K219 Gastro-esophageal reflux disease without esophagitis: Secondary | ICD-10-CM | POA: Diagnosis not present

## 2020-06-18 DIAGNOSIS — Z825 Family history of asthma and other chronic lower respiratory diseases: Secondary | ICD-10-CM | POA: Diagnosis not present

## 2020-06-18 DIAGNOSIS — E663 Overweight: Secondary | ICD-10-CM | POA: Diagnosis not present

## 2020-06-18 DIAGNOSIS — Z8546 Personal history of malignant neoplasm of prostate: Secondary | ICD-10-CM | POA: Diagnosis not present

## 2020-07-27 DIAGNOSIS — X32XXXD Exposure to sunlight, subsequent encounter: Secondary | ICD-10-CM | POA: Diagnosis not present

## 2020-07-27 DIAGNOSIS — L570 Actinic keratosis: Secondary | ICD-10-CM | POA: Diagnosis not present

## 2020-10-06 DIAGNOSIS — R110 Nausea: Secondary | ICD-10-CM | POA: Diagnosis not present

## 2020-10-06 DIAGNOSIS — K449 Diaphragmatic hernia without obstruction or gangrene: Secondary | ICD-10-CM | POA: Diagnosis not present

## 2020-10-20 IMAGING — CT CT ABDOMEN W/ CM
2 of 3 series · 12 of 32 positions shown, 18 images · IV contrast (iopamidol)
Comparison: None.

CLINICAL DATA: History of large hiatal hernia with worsening pain.,
discomfort in the chest.

EXAM:
CT ABDOMEN WITH CONTRAST
TECHNIQUE: Multidetector CT imaging of the abdomen was performed using the
standard protocol following bolus administration of intravenous
contrast.
CONTRAST:  100mL RYZ6CH-SNN IOPAMIDOL (RYZ6CH-SNN) INJECTION 61%

[Series 2: abdroutine 5.0 i40s 1 · axial · 0.80mm/px · z∈[-156,+114]mm · 10 of 67 slices shown, 16 images]
[im 7/67  soft-tissue]
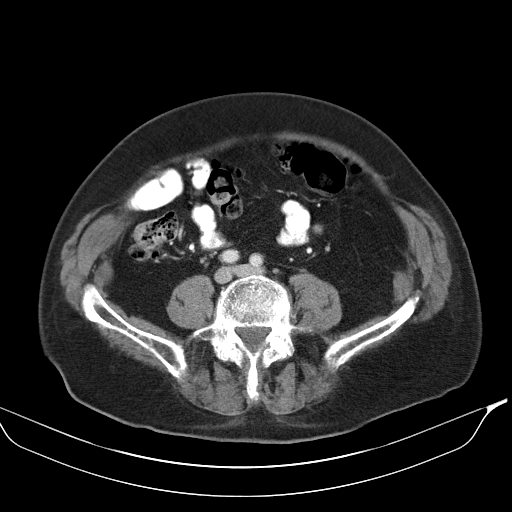
[im 7/67  bone]
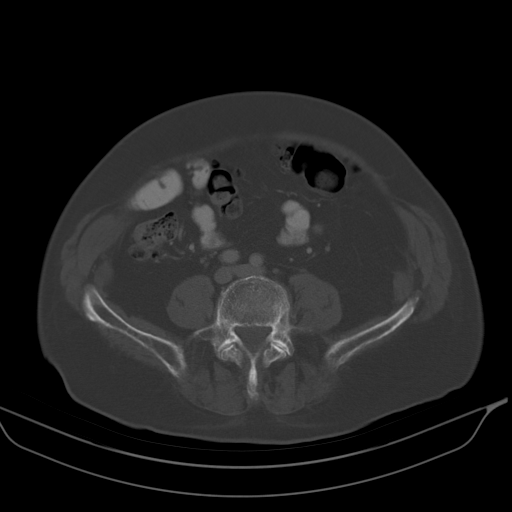
[im 13/67  soft-tissue]
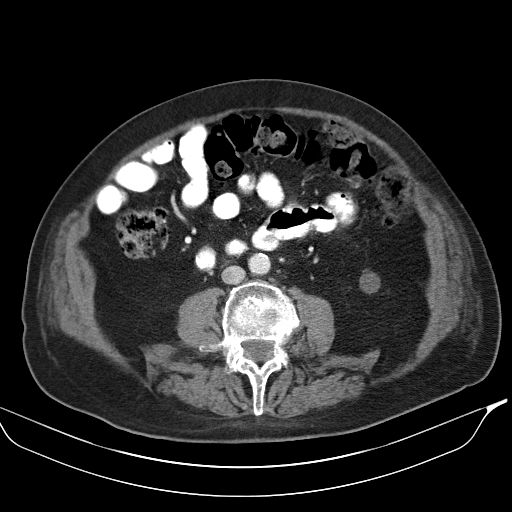
[im 19/67  soft-tissue]
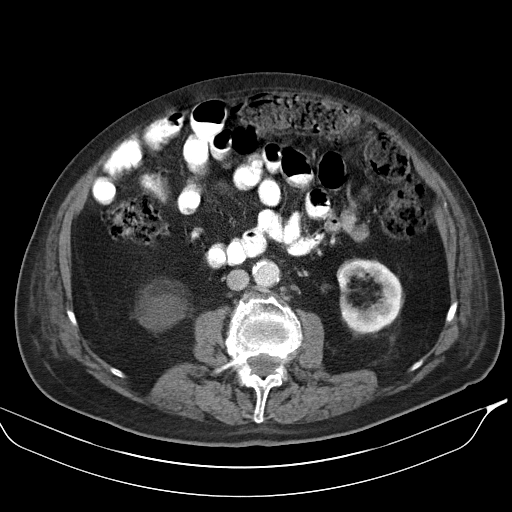
[im 25/67  soft-tissue]
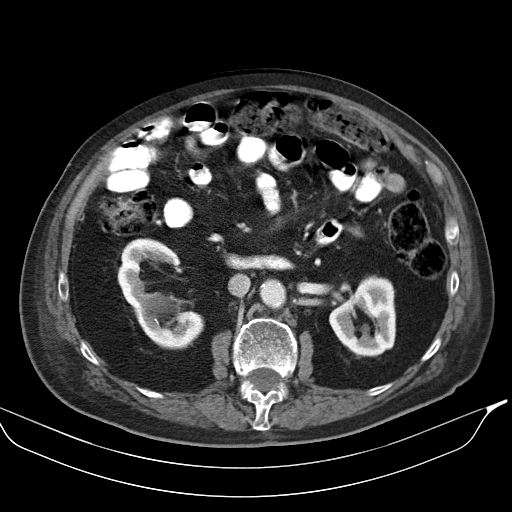
[im 31/67  soft-tissue]
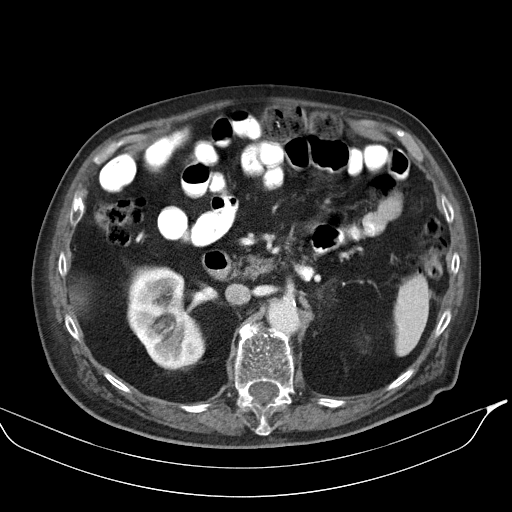
[im 37/67  soft-tissue]
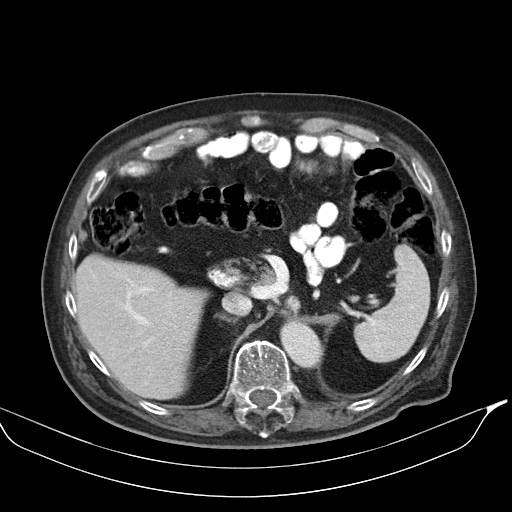
[im 43/67  soft-tissue]
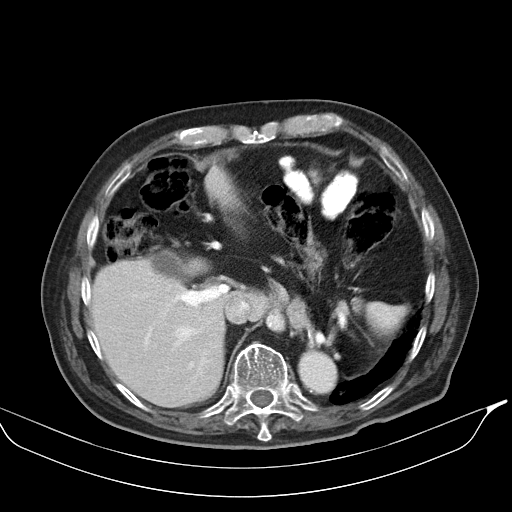
[im 43/67  lung]
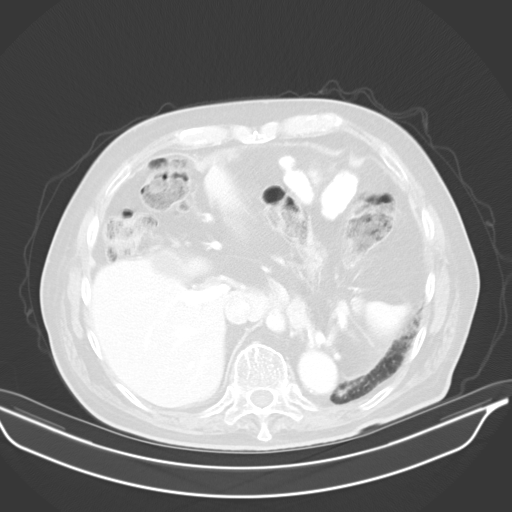
[im 49/67  soft-tissue]
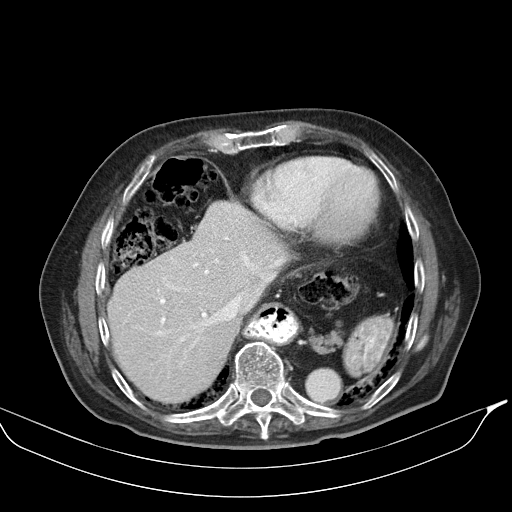
[im 49/67  lung]
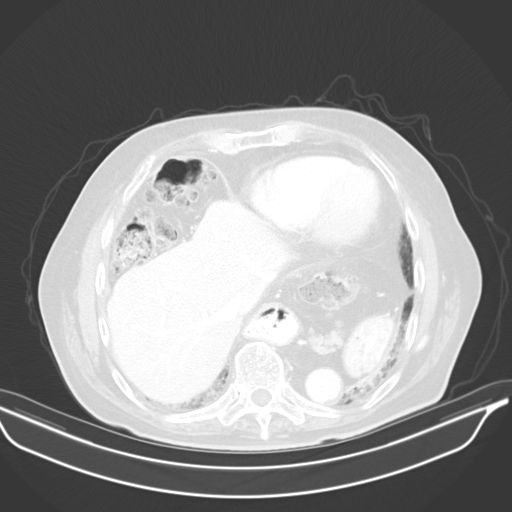
[im 55/67  soft-tissue]
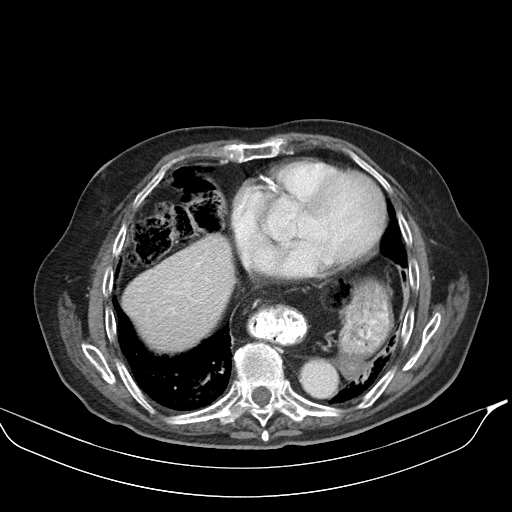
[im 55/67  lung]
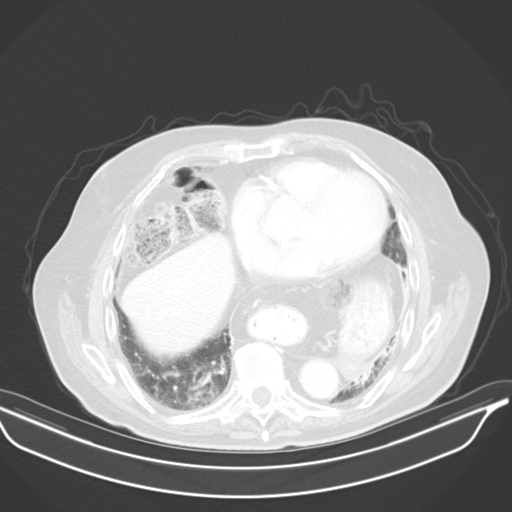
[im 55/67  bone]
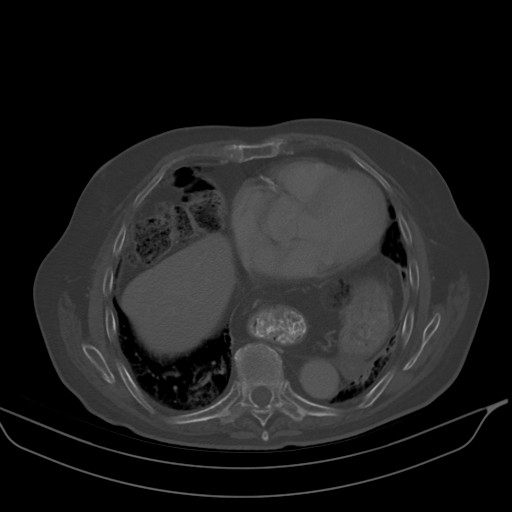
[im 61/67  soft-tissue]
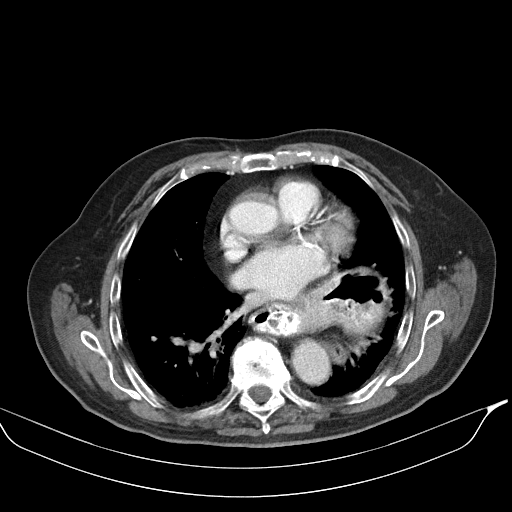
[im 61/67  lung]
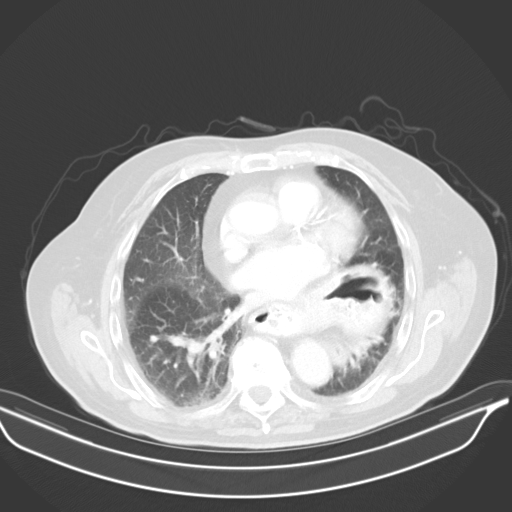

[Series 5: lungs · axial · 0.80mm/px · z∈[+36,+48]mm · 2 of 61 slices shown]
[im 7/61  soft-tissue]
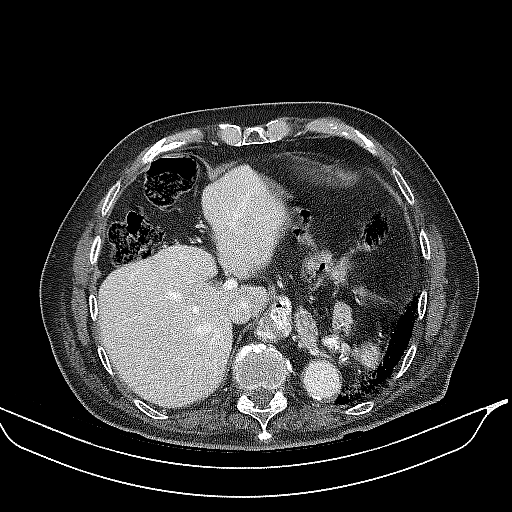
[im 13/61  soft-tissue]
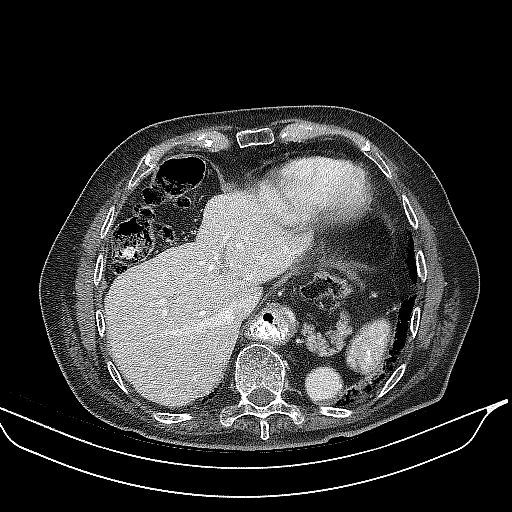

[12 of 32 positions shown; findings below may reference images not displayed]

FINDINGS: Lower chest: Basilar atelectasis. Hiatal hernia extending into the
chest with inverted stomach, see below.

Hepatobiliary: Liver is normal. Portal vein is patent. No signs of
pericholecystic stranding or biliary ductal dilation.

Pancreas: Portion of the pancreas is herniated into the chest with
similar appearance to the prior study. No signs of peripancreatic
inflammation or ductal dilation.

Spleen: Spleen is normal size no focal lesion.

Adrenals/Urinary Tract: Right renal cyst is actually smaller than on
the previous study measuring approximately 4.3 cm in greatest axial
dimension, previously 6 cm. Mild renal cortical thinning
bilaterally. No suspicious renal lesion or hydronephrosis.

Stomach/Bowel: Inverted stomach, organo-axial gastric volvulus with
similar appearance. The finding that is new is that there is
herniation of a portion of the transverse colon with perhaps minimal
stranding about the colon at the level of the esophageal hiatus to
the anterior aspect of the defect. This knuckle of bowel shows
narrowing as it herniates through the esophageal hiatus at this
level but does not show frank evidence of obstruction at this time
though there is formed stool within the small portion of bowel that
is herniated into the chest. Signs of colonic diverticulosis without
signs of diverticulitis.

Vascular/Lymphatic: Calcified and noncalcified atherosclerotic
plaque throughout the abdominal aorta, no signs of aneurysm in the
abdomen, no signs of adenopathy.

Other: No signs of free air. No signs of ascites or abscess.

Musculoskeletal: Osteopenia with signs of compression fractures with
worsening of T8 compression fracture now with complete to near
complete loss of height as compared to the study of 09/09/2018 where
there was approximately 40-50% loss of height. Similar appearance of
L3 and L4 compression fractures.
IMPRESSION: 1. Inverted stomach, organo-axial gastric volvulus with similar
appearance to the prior study.
2. Herniation of a portion of the transverse colon into the chest is
a new finding compared to the prior study and shows narrowing at the
level of herniation. This would be at risk for or developing
obstruction, no signs of obstruction at this time.
3. Perhaps mild stranding about the colon at the level of herniation
though motion limits assessment in this area. No signs of stranding
about the mildly dilated segment in the chest.
4. Osteopenia with signs of compression fractures with worsening of
T8 compression fracture as compared to the study of 09/09/2018 where
there was approximately 40-50% loss of height. Similar appearance of
L3 and L4 compression fractures.
5. Decrease in size of right renal cyst.
6. Aortic atherosclerosis.

Aortic Atherosclerosis ([id number]).

## 2021-01-30 DIAGNOSIS — K449 Diaphragmatic hernia without obstruction or gangrene: Secondary | ICD-10-CM | POA: Diagnosis not present

## 2021-01-30 DIAGNOSIS — E039 Hypothyroidism, unspecified: Secondary | ICD-10-CM | POA: Diagnosis not present

## 2021-01-30 DIAGNOSIS — R1310 Dysphagia, unspecified: Secondary | ICD-10-CM | POA: Diagnosis not present

## 2021-01-30 DIAGNOSIS — R0602 Shortness of breath: Secondary | ICD-10-CM | POA: Diagnosis not present

## 2021-01-30 DIAGNOSIS — I2510 Atherosclerotic heart disease of native coronary artery without angina pectoris: Secondary | ICD-10-CM | POA: Diagnosis not present

## 2021-01-30 DIAGNOSIS — J984 Other disorders of lung: Secondary | ICD-10-CM | POA: Diagnosis not present

## 2021-01-30 DIAGNOSIS — Z79899 Other long term (current) drug therapy: Secondary | ICD-10-CM | POA: Diagnosis not present

## 2021-01-30 DIAGNOSIS — I517 Cardiomegaly: Secondary | ICD-10-CM | POA: Diagnosis not present

## 2021-01-30 DIAGNOSIS — R918 Other nonspecific abnormal finding of lung field: Secondary | ICD-10-CM | POA: Diagnosis not present

## 2021-01-30 DIAGNOSIS — R0600 Dyspnea, unspecified: Secondary | ICD-10-CM | POA: Diagnosis not present

## 2021-01-30 DIAGNOSIS — K219 Gastro-esophageal reflux disease without esophagitis: Secondary | ICD-10-CM | POA: Diagnosis not present

## 2021-01-30 DIAGNOSIS — R059 Cough, unspecified: Secondary | ICD-10-CM | POA: Diagnosis not present

## 2021-01-30 DIAGNOSIS — E7800 Pure hypercholesterolemia, unspecified: Secondary | ICD-10-CM | POA: Diagnosis not present

## 2021-01-30 DIAGNOSIS — R7989 Other specified abnormal findings of blood chemistry: Secondary | ICD-10-CM | POA: Diagnosis not present

## 2021-01-30 DIAGNOSIS — Z7982 Long term (current) use of aspirin: Secondary | ICD-10-CM | POA: Diagnosis not present

## 2021-01-30 DIAGNOSIS — J441 Chronic obstructive pulmonary disease with (acute) exacerbation: Secondary | ICD-10-CM | POA: Diagnosis not present

## 2021-01-30 DIAGNOSIS — Z20822 Contact with and (suspected) exposure to covid-19: Secondary | ICD-10-CM | POA: Diagnosis not present

## 2021-01-30 DIAGNOSIS — R9431 Abnormal electrocardiogram [ECG] [EKG]: Secondary | ICD-10-CM | POA: Diagnosis not present

## 2021-01-30 DIAGNOSIS — J9601 Acute respiratory failure with hypoxia: Secondary | ICD-10-CM | POA: Diagnosis not present

## 2021-01-31 DIAGNOSIS — R1310 Dysphagia, unspecified: Secondary | ICD-10-CM | POA: Diagnosis not present

## 2021-01-31 DIAGNOSIS — K219 Gastro-esophageal reflux disease without esophagitis: Secondary | ICD-10-CM | POA: Diagnosis not present

## 2021-01-31 DIAGNOSIS — R059 Cough, unspecified: Secondary | ICD-10-CM | POA: Diagnosis not present

## 2021-01-31 DIAGNOSIS — R7989 Other specified abnormal findings of blood chemistry: Secondary | ICD-10-CM | POA: Diagnosis not present

## 2021-01-31 DIAGNOSIS — J9601 Acute respiratory failure with hypoxia: Secondary | ICD-10-CM | POA: Diagnosis not present

## 2021-01-31 DIAGNOSIS — E039 Hypothyroidism, unspecified: Secondary | ICD-10-CM | POA: Diagnosis not present

## 2021-01-31 DIAGNOSIS — J441 Chronic obstructive pulmonary disease with (acute) exacerbation: Secondary | ICD-10-CM | POA: Diagnosis not present

## 2021-02-01 DIAGNOSIS — J441 Chronic obstructive pulmonary disease with (acute) exacerbation: Secondary | ICD-10-CM | POA: Diagnosis not present

## 2021-02-01 DIAGNOSIS — R7989 Other specified abnormal findings of blood chemistry: Secondary | ICD-10-CM | POA: Diagnosis not present

## 2021-02-01 DIAGNOSIS — K219 Gastro-esophageal reflux disease without esophagitis: Secondary | ICD-10-CM | POA: Diagnosis not present

## 2021-02-01 DIAGNOSIS — J9601 Acute respiratory failure with hypoxia: Secondary | ICD-10-CM | POA: Diagnosis not present

## 2021-02-01 DIAGNOSIS — E039 Hypothyroidism, unspecified: Secondary | ICD-10-CM | POA: Diagnosis not present

## 2021-02-02 DIAGNOSIS — R7989 Other specified abnormal findings of blood chemistry: Secondary | ICD-10-CM | POA: Diagnosis not present

## 2021-02-02 DIAGNOSIS — J9601 Acute respiratory failure with hypoxia: Secondary | ICD-10-CM | POA: Diagnosis not present

## 2021-02-02 DIAGNOSIS — Z515 Encounter for palliative care: Secondary | ICD-10-CM | POA: Diagnosis not present

## 2021-02-02 DIAGNOSIS — Z9189 Other specified personal risk factors, not elsewhere classified: Secondary | ICD-10-CM | POA: Diagnosis not present

## 2021-02-02 DIAGNOSIS — Z7189 Other specified counseling: Secondary | ICD-10-CM | POA: Diagnosis not present

## 2021-02-02 DIAGNOSIS — R1312 Dysphagia, oropharyngeal phase: Secondary | ICD-10-CM | POA: Diagnosis not present

## 2021-02-02 DIAGNOSIS — Z03818 Encounter for observation for suspected exposure to other biological agents ruled out: Secondary | ICD-10-CM | POA: Diagnosis not present

## 2021-02-02 DIAGNOSIS — R0600 Dyspnea, unspecified: Secondary | ICD-10-CM | POA: Diagnosis not present

## 2021-02-02 DIAGNOSIS — J9621 Acute and chronic respiratory failure with hypoxia: Secondary | ICD-10-CM | POA: Diagnosis not present

## 2021-05-16 DIAGNOSIS — X32XXXD Exposure to sunlight, subsequent encounter: Secondary | ICD-10-CM | POA: Diagnosis not present

## 2021-05-16 DIAGNOSIS — L570 Actinic keratosis: Secondary | ICD-10-CM | POA: Diagnosis not present

## 2021-05-16 DIAGNOSIS — C44319 Basal cell carcinoma of skin of other parts of face: Secondary | ICD-10-CM | POA: Diagnosis not present

## 2021-06-16 DIAGNOSIS — Z85828 Personal history of other malignant neoplasm of skin: Secondary | ICD-10-CM | POA: Diagnosis not present

## 2021-06-16 DIAGNOSIS — L304 Erythema intertrigo: Secondary | ICD-10-CM | POA: Diagnosis not present

## 2021-06-16 DIAGNOSIS — Z08 Encounter for follow-up examination after completed treatment for malignant neoplasm: Secondary | ICD-10-CM | POA: Diagnosis not present

## 2022-05-30 ENCOUNTER — Emergency Department (HOSPITAL_COMMUNITY): Payer: No Typology Code available for payment source

## 2022-05-30 ENCOUNTER — Other Ambulatory Visit: Payer: Self-pay

## 2022-05-30 ENCOUNTER — Encounter (HOSPITAL_COMMUNITY): Payer: Self-pay

## 2022-05-30 ENCOUNTER — Inpatient Hospital Stay (HOSPITAL_COMMUNITY)
Admission: EM | Admit: 2022-05-30 | Discharge: 2022-06-05 | DRG: 682 | Disposition: A | Discharge status: Skilled Nursing Facility | Payer: No Typology Code available for payment source | Attending: Internal Medicine | Admitting: Internal Medicine

## 2022-05-30 DIAGNOSIS — N179 Acute kidney failure, unspecified: Secondary | ICD-10-CM | POA: Diagnosis not present

## 2022-05-30 DIAGNOSIS — M25521 Pain in right elbow: Secondary | ICD-10-CM | POA: Diagnosis not present

## 2022-05-30 DIAGNOSIS — K219 Gastro-esophageal reflux disease without esophagitis: Secondary | ICD-10-CM | POA: Diagnosis present

## 2022-05-30 DIAGNOSIS — N3289 Other specified disorders of bladder: Secondary | ICD-10-CM | POA: Diagnosis not present

## 2022-05-30 DIAGNOSIS — R1312 Dysphagia, oropharyngeal phase: Secondary | ICD-10-CM | POA: Diagnosis not present

## 2022-05-30 DIAGNOSIS — E7800 Pure hypercholesterolemia, unspecified: Secondary | ICD-10-CM | POA: Diagnosis present

## 2022-05-30 DIAGNOSIS — Z79890 Hormone replacement therapy (postmenopausal): Secondary | ICD-10-CM | POA: Diagnosis not present

## 2022-05-30 DIAGNOSIS — Z923 Personal history of irradiation: Secondary | ICD-10-CM

## 2022-05-30 DIAGNOSIS — J9811 Atelectasis: Secondary | ICD-10-CM | POA: Diagnosis not present

## 2022-05-30 DIAGNOSIS — S22050A Wedge compression fracture of T5-T6 vertebra, initial encounter for closed fracture: Secondary | ICD-10-CM | POA: Diagnosis not present

## 2022-05-30 DIAGNOSIS — R339 Retention of urine, unspecified: Secondary | ICD-10-CM | POA: Diagnosis not present

## 2022-05-30 DIAGNOSIS — G9349 Other encephalopathy: Secondary | ICD-10-CM | POA: Diagnosis present

## 2022-05-30 DIAGNOSIS — N390 Urinary tract infection, site not specified: Secondary | ICD-10-CM | POA: Diagnosis present

## 2022-05-30 DIAGNOSIS — N320 Bladder-neck obstruction: Secondary | ICD-10-CM | POA: Diagnosis present

## 2022-05-30 DIAGNOSIS — K449 Diaphragmatic hernia without obstruction or gangrene: Secondary | ICD-10-CM | POA: Diagnosis not present

## 2022-05-30 DIAGNOSIS — Z743 Need for continuous supervision: Secondary | ICD-10-CM | POA: Diagnosis not present

## 2022-05-30 DIAGNOSIS — R2681 Unsteadiness on feet: Secondary | ICD-10-CM | POA: Diagnosis not present

## 2022-05-30 DIAGNOSIS — Z8546 Personal history of malignant neoplasm of prostate: Secondary | ICD-10-CM | POA: Diagnosis not present

## 2022-05-30 DIAGNOSIS — G9341 Metabolic encephalopathy: Secondary | ICD-10-CM | POA: Diagnosis present

## 2022-05-30 DIAGNOSIS — E039 Hypothyroidism, unspecified: Secondary | ICD-10-CM | POA: Diagnosis present

## 2022-05-30 DIAGNOSIS — Z7401 Bed confinement status: Secondary | ICD-10-CM | POA: Diagnosis not present

## 2022-05-30 DIAGNOSIS — R531 Weakness: Secondary | ICD-10-CM | POA: Diagnosis not present

## 2022-05-30 DIAGNOSIS — M4854XA Collapsed vertebra, not elsewhere classified, thoracic region, initial encounter for fracture: Secondary | ICD-10-CM | POA: Diagnosis present

## 2022-05-30 DIAGNOSIS — L89156 Pressure-induced deep tissue damage of sacral region: Secondary | ICD-10-CM | POA: Diagnosis present

## 2022-05-30 DIAGNOSIS — N1330 Unspecified hydronephrosis: Secondary | ICD-10-CM | POA: Diagnosis present

## 2022-05-30 DIAGNOSIS — E876 Hypokalemia: Secondary | ICD-10-CM | POA: Diagnosis present

## 2022-05-30 DIAGNOSIS — K3189 Other diseases of stomach and duodenum: Secondary | ICD-10-CM | POA: Diagnosis present

## 2022-05-30 DIAGNOSIS — Z79899 Other long term (current) drug therapy: Secondary | ICD-10-CM | POA: Diagnosis not present

## 2022-05-30 DIAGNOSIS — E871 Hypo-osmolality and hyponatremia: Secondary | ICD-10-CM | POA: Diagnosis not present

## 2022-05-30 DIAGNOSIS — M810 Age-related osteoporosis without current pathological fracture: Secondary | ICD-10-CM | POA: Diagnosis present

## 2022-05-30 DIAGNOSIS — Z7982 Long term (current) use of aspirin: Secondary | ICD-10-CM

## 2022-05-30 DIAGNOSIS — R338 Other retention of urine: Secondary | ICD-10-CM | POA: Diagnosis not present

## 2022-05-30 DIAGNOSIS — N200 Calculus of kidney: Secondary | ICD-10-CM | POA: Diagnosis not present

## 2022-05-30 DIAGNOSIS — M4856XA Collapsed vertebra, not elsewhere classified, lumbar region, initial encounter for fracture: Secondary | ICD-10-CM | POA: Diagnosis present

## 2022-05-30 DIAGNOSIS — R9431 Abnormal electrocardiogram [ECG] [EKG]: Secondary | ICD-10-CM | POA: Diagnosis not present

## 2022-05-30 DIAGNOSIS — J398 Other specified diseases of upper respiratory tract: Secondary | ICD-10-CM | POA: Diagnosis present

## 2022-05-30 DIAGNOSIS — I6529 Occlusion and stenosis of unspecified carotid artery: Secondary | ICD-10-CM | POA: Diagnosis not present

## 2022-05-30 DIAGNOSIS — S199XXA Unspecified injury of neck, initial encounter: Secondary | ICD-10-CM | POA: Diagnosis not present

## 2022-05-30 DIAGNOSIS — R9082 White matter disease, unspecified: Secondary | ICD-10-CM | POA: Diagnosis not present

## 2022-05-30 DIAGNOSIS — N136 Pyonephrosis: Secondary | ICD-10-CM | POA: Diagnosis present

## 2022-05-30 DIAGNOSIS — M79631 Pain in right forearm: Secondary | ICD-10-CM | POA: Diagnosis not present

## 2022-05-30 DIAGNOSIS — N19 Unspecified kidney failure: Secondary | ICD-10-CM | POA: Diagnosis present

## 2022-05-30 DIAGNOSIS — Z9049 Acquired absence of other specified parts of digestive tract: Secondary | ICD-10-CM

## 2022-05-30 DIAGNOSIS — K439 Ventral hernia without obstruction or gangrene: Secondary | ICD-10-CM | POA: Diagnosis not present

## 2022-05-30 DIAGNOSIS — M6281 Muscle weakness (generalized): Secondary | ICD-10-CM | POA: Diagnosis not present

## 2022-05-30 DIAGNOSIS — N139 Obstructive and reflux uropathy, unspecified: Secondary | ICD-10-CM | POA: Diagnosis not present

## 2022-05-30 DIAGNOSIS — S22060A Wedge compression fracture of T7-T8 vertebra, initial encounter for closed fracture: Secondary | ICD-10-CM | POA: Diagnosis not present

## 2022-05-30 DIAGNOSIS — R404 Transient alteration of awareness: Secondary | ICD-10-CM | POA: Diagnosis not present

## 2022-05-30 DIAGNOSIS — G319 Degenerative disease of nervous system, unspecified: Secondary | ICD-10-CM | POA: Diagnosis not present

## 2022-05-30 DIAGNOSIS — S0990XA Unspecified injury of head, initial encounter: Secondary | ICD-10-CM | POA: Diagnosis not present

## 2022-05-30 DIAGNOSIS — S22000A Wedge compression fracture of unspecified thoracic vertebra, initial encounter for closed fracture: Secondary | ICD-10-CM | POA: Diagnosis present

## 2022-05-30 DIAGNOSIS — M47812 Spondylosis without myelopathy or radiculopathy, cervical region: Secondary | ICD-10-CM | POA: Diagnosis not present

## 2022-05-30 HISTORY — DX: Pure hypercholesterolemia, unspecified: E78.00

## 2022-05-30 HISTORY — DX: Gastro-esophageal reflux disease without esophagitis: K21.9

## 2022-05-30 HISTORY — DX: Age-related osteoporosis without current pathological fracture: M81.0

## 2022-05-30 HISTORY — DX: Diaphragmatic hernia without obstruction or gangrene: K44.9

## 2022-05-30 HISTORY — DX: Disorder of thyroid, unspecified: E07.9

## 2022-05-30 LAB — URINALYSIS, ROUTINE W REFLEX MICROSCOPIC
Bilirubin Urine: NEGATIVE
Glucose, UA: NEGATIVE mg/dL
Ketones, ur: NEGATIVE mg/dL
Nitrite: NEGATIVE
Protein, ur: 30 mg/dL — AB
Specific Gravity, Urine: 1.014 (ref 1.005–1.030)
WBC, UA: >50 WBC/hpf — ABNORMAL HIGH (ref 0–5)
pH: 5 (ref 5.0–8.0)

## 2022-05-30 LAB — CBC
HCT: 36.6 % — ABNORMAL LOW (ref 39.0–52.0)
Hemoglobin: 12.3 g/dL — ABNORMAL LOW (ref 13.0–17.0)
MCH: 29.9 pg (ref 26.0–34.0)
MCHC: 33.6 g/dL (ref 30.0–36.0)
MCV: 88.8 fL (ref 80.0–100.0)
Platelets: 140 10*3/uL — ABNORMAL LOW (ref 150–400)
RBC: 4.12 MIL/uL — ABNORMAL LOW (ref 4.22–5.81)
RDW: 14.8 % (ref 11.5–15.5)
WBC: 19 10*3/uL — ABNORMAL HIGH (ref 4.0–10.5)
nRBC: 0 % (ref 0.0–0.2)

## 2022-05-30 LAB — LACTIC ACID, PLASMA
Lactic Acid, Venous: 1.6 mmol/L (ref 0.5–1.9)
Lactic Acid, Venous: 0.9 mmol/L (ref 0.5–1.9)

## 2022-05-30 LAB — SODIUM: Sodium: 131 mmol/L — ABNORMAL LOW (ref 135–145)

## 2022-05-30 LAB — COMPREHENSIVE METABOLIC PANEL
ALT: 17 U/L (ref 0–44)
AST: 18 U/L (ref 15–41)
Albumin: 2.8 g/dL — ABNORMAL LOW (ref 3.5–5.0)
Alkaline Phosphatase: 104 U/L (ref 38–126)
Anion gap: 16 — ABNORMAL HIGH (ref 5–15)
BUN: 170 mg/dL — ABNORMAL HIGH (ref 8–23)
CO2: 18 mmol/L — ABNORMAL LOW (ref 22–32)
Calcium: 8.8 mg/dL — ABNORMAL LOW (ref 8.9–10.3)
Chloride: 92 mmol/L — ABNORMAL LOW (ref 98–111)
Creatinine, Ser: 4.06 mg/dL — ABNORMAL HIGH (ref 0.61–1.24)
GFR, Estimated: 13 mL/min — ABNORMAL LOW (ref >60)
Glucose, Bld: 133 mg/dL — ABNORMAL HIGH (ref 70–99)
Potassium: 4.5 mmol/L (ref 3.5–5.1)
Sodium: 126 mmol/L — ABNORMAL LOW (ref 135–145)
Total Bilirubin: 0.8 mg/dL (ref 0.3–1.2)
Total Protein: 6.6 g/dL (ref 6.5–8.1)

## 2022-05-30 LAB — PSA: Prostatic Specific Antigen: 0.2 ng/mL (ref 0.00–4.00)

## 2022-05-30 LAB — APTT: aPTT: 34 seconds (ref 24–36)

## 2022-05-30 LAB — PROTIME-INR
INR: 1.2 (ref 0.8–1.2)
Prothrombin Time: 15.2 seconds (ref 11.4–15.2)

## 2022-05-30 MED ORDER — SODIUM CHLORIDE 0.9 % IV SOLN
1.0000 g | INTRAVENOUS | Status: DC
  Administered 2022-05-31 – 2022-06-04 (×5): 1 g via INTRAVENOUS
  Filled 2022-05-30 (×5): qty 10

## 2022-05-30 MED ORDER — ONDANSETRON HCL 4 MG PO TABS: 4.0000 mg | ORAL_TABLET | Freq: Four times a day (QID) | ORAL | Status: DC | PRN

## 2022-05-30 MED ORDER — SENNOSIDES-DOCUSATE SODIUM 8.6-50 MG PO TABS
1.0000 | ORAL_TABLET | Freq: Every evening | ORAL | Status: DC | PRN
  Administered 2022-05-31: 1 via ORAL
  Filled 2022-05-30: qty 1

## 2022-05-30 MED ORDER — SODIUM CHLORIDE 0.9 % IV SOLN
INTRAVENOUS | Status: AC
Start: 2022-05-30 — End: 2022-05-31

## 2022-05-30 MED ORDER — SODIUM CHLORIDE 0.9 % IV BOLUS
1000.0000 mL | Freq: Once | INTRAVENOUS | Status: AC
  Administered 2022-05-30: 1000 mL via INTRAVENOUS

## 2022-05-30 MED ORDER — HEPARIN SODIUM (PORCINE) 5000 UNIT/ML IJ SOLN
5000.0000 [IU] | Freq: Three times a day (TID) | INTRAMUSCULAR | Status: DC
Start: 2022-05-30 — End: 2022-06-03
  Administered 2022-05-30 – 2022-06-03 (×12): 5000 [IU] via SUBCUTANEOUS
  Filled 2022-05-30 (×10): qty 1

## 2022-05-30 MED ORDER — SODIUM CHLORIDE 0.9 % IV SOLN
1.0000 g | Freq: Once | INTRAVENOUS | Status: AC
  Administered 2022-05-30: 1 g via INTRAVENOUS
  Filled 2022-05-30: qty 10

## 2022-05-30 MED ORDER — ACETAMINOPHEN 650 MG RE SUPP: 650.0000 mg | Freq: Four times a day (QID) | RECTAL | Status: DC | PRN

## 2022-05-30 MED ORDER — ONDANSETRON HCL 4 MG/2ML IJ SOLN: 4.0000 mg | Freq: Four times a day (QID) | INTRAMUSCULAR | Status: DC | PRN

## 2022-05-30 MED ORDER — SODIUM CHLORIDE 0.9% FLUSH
3.0000 mL | Freq: Two times a day (BID) | INTRAVENOUS | Status: DC
  Administered 2022-05-31 – 2022-06-04 (×7): 3 mL via INTRAVENOUS

## 2022-05-30 MED ORDER — ACETAMINOPHEN 325 MG PO TABS
650.0000 mg | ORAL_TABLET | Freq: Four times a day (QID) | ORAL | Status: DC | PRN
  Administered 2022-05-31 – 2022-06-05 (×3): 650 mg via ORAL
  Filled 2022-05-30 (×3): qty 2
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
## 2022-05-30 NOTE — Assessment & Plan Note (Signed)
In setting of bladder outlet obstruction. WBC 19 on admission with abnormal urinalysis. -Continue empiric IV ceftriaxone -Follow urine culture

## 2022-05-30 NOTE — Assessment & Plan Note (Addendum)
5 mm nodule along the right lower trachea seen on CT imaging.  Most likely adherent mucus given history however endobronchial lesion not excluded.  Findings discussed with patient's son.
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
## 2022-05-30 NOTE — Assessment & Plan Note (Addendum)
Bilateral hydroureteronephrosis Acute renal failure due to BOO in setting of imaging concerning for invasion by prostate carcinoma. -Foley catheter placed in ED 8/2 with improved UOP -Monitor strict I/O's -Urology to see -Continue finasteride and tamsulosin

## 2022-05-30 NOTE — Assessment & Plan Note (Signed)
Sodium 126 on admission in setting of decreased p.o. intake and acute renal failure. -Continue IV NS'@75'$  mL/hour overnight -Repeat sodium tonight and again in a.m.

## 2022-05-30 NOTE — Assessment & Plan Note (Signed)
New compression fractures of T6 and T8 with similar compression fracture of T7 seen on CT imaging.  Also noted to have similar appearing L3 compression fracture and increased compression deformity of L4. -PT/OT eval

## 2022-05-30 NOTE — Plan of Care (Signed)
  Problem: Clinical Measurements: Goal: Ability to maintain clinical measurements within normal limits will improve Outcome: Progressing Goal: Will remain free from infection Outcome: Progressing Goal: Diagnostic test results will improve Outcome: Progressing Goal: Respiratory complications will improve Outcome: Progressing Goal: Cardiovascular complication will be avoided Outcome: Progressing   Problem: Elimination: Goal: Will not experience complications related to bowel motility Outcome: Progressing Goal: Will not experience complications related to urinary retention Outcome: Progressing   Problem: Activity: Goal: Risk for activity intolerance will decrease Outcome: Progressing

## 2022-05-30 NOTE — Hospital Course (Signed)
Kevin Carr is a 88 y.o. male with medical history significant for remote prostate cancer, hypothyroidism, GERD, hiatal hernia with inverted stomach who is admitted with acute renal failure due to bladder outlet obstruction.

## 2022-05-30 NOTE — Assessment & Plan Note (Signed)
Significant functional decline over the last 3 weeks in setting of acute renal failure.  Living independently previously.  Anticipate improvement with management as above.

## 2022-05-30 NOTE — ED Triage Notes (Signed)
Pt presents with declining health over the past month. Pt having gradually increasing weakness. Prior to last month pt was independent, able to drive, and able to care for himself. Per family pt has had some recent falls. Pt is alert and oriented to person and place.

## 2022-05-30 NOTE — Assessment & Plan Note (Signed)
Continue Synthroid °
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
## 2022-05-30 NOTE — Consult Note (Signed)
Urology Consult   Physician requesting consult: Dr. Zada Finders  Reason for consult: Urinary retention, bilateral hydronephrosis, acute kidney injury  History of Present Illness: Kevin Carr is a 88 y.o. with a history Gleason 3+4=7 adenocarcinoma of the prostate s/p treatment with radiation in 2001.  He has been followed by Dr. Louis Meckel up until 2016 at which time his PSA was 0.15.  Considering his long interval from treatment without evidence of recurrence, he was released from urologic follow up at that time.  He presented to the ER tonight with the help of his son who has noticed generally progressive weakness, decreased strength, and overall loss of conditioning and ability to independently care for himself.  He had been driving himself around one month ago. Labs revealed a serum Cr of 4.1 (baseline is normal) and a CT scan without contrast indicated severe bladder distention with abnormal contour of the prostate, asymmetric thickening of the bladder, and bilateral hydronephrosis.  A catheter was placed in the ED with 1200 cc of urine returned.  Past Medical History:  Diagnosis Date   Cancer [facility])    prostate   GERD (gastroesophageal reflux disease)    GI bleed    Hiatal hernia    Hypercholesteremia    Osteoporosis    Thyroid disease     Past Surgical History:  Procedure Laterality Date   APPENDECTOMY     TONSILLECTOMY      Medications:  Home meds:  No current facility-administered medications on file prior to encounter.   Current Outpatient Medications on File Prior to Encounter  Medication Sig Dispense Refill   levothyroxine (SYNTHROID, LEVOTHROID) 112 MCG tablet Take 112 mcg by mouth daily.     Tamsulosin HCl (FLOMAX) 0.4 MG CAPS Take 1 capsule (0.4 mg total) by mouth daily. 15 capsule 0   aspirin 81 MG tablet Take 81 mg by mouth daily.     finasteride (PROSCAR) 5 MG tablet Take 5 mg by mouth daily.     ibuprofen (ADVIL,MOTRIN) 600 MG tablet Take 1 tablet (600 mg  total) by mouth every 6 (six) hours as needed for pain. 30 tablet 0   Multiple Vitamins-Minerals (MULTIVITAMIN WITH MINERALS) tablet Take 1 tablet by mouth daily.     omeprazole (PRILOSEC) 20 MG capsule Take 20 mg by mouth daily.     oxyCODONE-acetaminophen (PERCOCET) 5-325 MG per tablet Take 2 tablets by mouth every 4 (four) hours as needed for pain. 10 tablet 0     Scheduled Meds:  heparin  5,000 Units Subcutaneous Q8H   sodium chloride flush  3 mL Intravenous Q12H   Continuous Infusions:  sodium chloride     [START ON 05/31/2022] cefTRIAXone (ROCEPHIN)  IV     PRN Meds:.acetaminophen **OR** acetaminophen, ondansetron **OR** ondansetron (ZOFRAN) IV, senna-docusate  Allergies: No Known Allergies  No family history on file.  Social History:  reports that he has never smoked. He has never used smokeless tobacco. He reports that he does not drink alcohol and does not use drugs.  ROS: A complete review of systems was performed.  All systems are negative except for pertinent findings as noted.  Physical Exam:  Vital signs in last 24 hours: Temp:  [95.7 F (35.4 C)] 95.7 F (35.4 C) (08/02 1545) Pulse Rate:  [67-93] 67 (08/02 1830) Resp:  [13-26] 13 (08/02 1830) BP: (93-136)/(63-91) 116/66 (08/02 1830) SpO2:  [92 %-99 %] 96 % (08/02 1830) Weight:  [86.2 kg] 86.2 kg (08/02 1331) Constitutional: No acute distress Cardiovascular: No  JVD Respiratory: Normal respiratory effort GI: Abdomen is soft, nontender, nondistended. Genitourinary: No CVAT. Normal male phallus, testes are descended bilaterally and non-tender and without masses, scrotum is normal in appearance without lesions or masses, perineum is normal on inspection.  Foley with clear urine. Rectal: Prostate is smooth Lymphatic: No lymphadenopathy Neurologic: Grossly intact, no focal deficits Psychiatric: Normal mood and affect  Laboratory Data:  Recent Labs    05/30/22 1353  WBC 19.0*  HGB 12.3*  HCT 36.6*  PLT 140*     Recent Labs    05/30/22 1353  NA 126*  K 4.5  CL 92*  GLUCOSE 133*  BUN 170*  CALCIUM 8.8*  CREATININE 4.06*     Results for orders placed or performed during the hospital encounter of 05/30/22 (from the past 24 hour(s))  CBC     Status: Abnormal   Collection Time: 05/30/22  1:53 PM  Result Value Ref Range   WBC 19.0 (H) 4.0 - 10.5 K/uL   RBC 4.12 (L) 4.22 - 5.81 MIL/uL   Hemoglobin 12.3 (L) 13.0 - 17.0 g/dL   HCT 36.6 (L) 39.0 - 52.0 %   MCV 88.8 80.0 - 100.0 fL   MCH 29.9 26.0 - 34.0 pg   MCHC 33.6 30.0 - 36.0 g/dL   RDW 14.8 11.5 - 15.5 %   Platelets 140 (L) 150 - 400 K/uL   nRBC 0.0 0.0 - 0.2 %  Lactic acid, plasma     Status: None   Collection Time: 05/30/22  1:53 PM  Result Value Ref Range   Lactic Acid, Venous 1.6 0.5 - 1.9 mmol/L  Comprehensive metabolic panel     Status: Abnormal   Collection Time: 05/30/22  1:53 PM  Result Value Ref Range   Sodium 126 (L) 135 - 145 mmol/L   Potassium 4.5 3.5 - 5.1 mmol/L   Chloride 92 (L) 98 - 111 mmol/L   CO2 18 (L) 22 - 32 mmol/L   Glucose, Bld 133 (H) 70 - 99 mg/dL   BUN 170 (H) 8 - 23 mg/dL   Creatinine, Ser 4.06 (H) 0.61 - 1.24 mg/dL   Calcium 8.8 (L) 8.9 - 10.3 mg/dL   Total Protein 6.6 6.5 - 8.1 g/dL   Albumin 2.8 (L) 3.5 - 5.0 g/dL   AST 18 15 - 41 U/L   ALT 17 0 - 44 U/L   Alkaline Phosphatase 104 38 - 126 U/L   Total Bilirubin 0.8 0.3 - 1.2 mg/dL   GFR, Estimated 13 (L) >60 mL/min   Anion gap 16 (H) 5 - 15  Urinalysis, Routine w reflex microscopic Urine, Clean Catch     Status: Abnormal   Collection Time: 05/30/22  3:59 PM  Result Value Ref Range   Color, Urine YELLOW YELLOW   APPearance CLOUDY (A) CLEAR   Specific Gravity, Urine 1.014 1.005 - 1.030   pH 5.0 5.0 - 8.0   Glucose, UA NEGATIVE NEGATIVE mg/dL   Hgb urine dipstick LARGE (A) NEGATIVE   Bilirubin Urine NEGATIVE NEGATIVE   Ketones, ur NEGATIVE NEGATIVE mg/dL   Protein, ur 30 (A) NEGATIVE mg/dL   Nitrite NEGATIVE NEGATIVE   Leukocytes,Ua  LARGE (A) NEGATIVE   RBC / HPF 6-10 0 - 5 RBC/hpf   WBC, UA >50 (H) 0 - 5 WBC/hpf   Bacteria, UA RARE (A) NONE SEEN   No results found for this or any previous visit (from the past 240 hour(s)).  Renal Function: Recent Labs    05/30/22  1353  CREATININE 4.06*   Estimated Creatinine Clearance: 11.8 mL/min (A) (by C-G formula based on SCr of 4.06 mg/dL (H)).  Radiologic Imaging: CT CHEST ABDOMEN PELVIS WO CONTRAST  Result Date: 05/30/2022 CLINICAL DATA:  Declining health and weakness over the past month EXAM: CT CHEST, ABDOMEN AND PELVIS WITHOUT CONTRAST TECHNIQUE: Multidetector CT imaging of the chest, abdomen and pelvis was performed following the standard protocol without IV contrast. RADIATION DOSE REDUCTION: This exam was performed according to the departmental dose-optimization program which includes automated exposure control, adjustment of the mA and/or kV according to patient size and/or use of iterative reconstruction technique. COMPARISON:  Radiographs earlier today; CT 12/30/2018 FINDINGS: CT CHEST FINDINGS Cardiovascular: Aortic and coronary artery atherosclerotic calcification. Normal heart size. No pericardial effusion. Mediastinum/Nodes: No enlarged mediastinal, hilar, or axillary lymph nodes. 5 mm nodularity along the right lower trachea. The central airways are otherwise patent. Large hiatal hernia containing intrathoracic stomach and a portion of the transverse colon. Lungs/Pleura: Respiratory motion obscures detail. Interstitial and peribronchovascular thickening greatest in the lower lungs with mild bronchiectasis and architectural distortion. More confluent atelectasis/consolidation in the left lower lobe and lingula at least in part secondary to compression from the large hiatal hernia. Musculoskeletal: Demineralization. Since 12/30/2019, new compression fractures of T6 and T8 with similar compression fracture of T7. These demonstrate approximately 75% height loss. Slight  retropulsion of the inferior endplates of X8-P3 causes mild spinal canal narrowing. Thoracic kyphosis. CT ABDOMEN PELVIS FINDINGS Respiratory motion artifact obscures detail in the upper abdomen. Hepatobiliary: No focal liver abnormality is seen. No gallstones, gallbladder wall thickening, or biliary dilatation. Pancreas: Pancreatic atrophy. A portion of the tail of the pancreas is also herniated into the lower left chest. Spleen: Normal in size without focal abnormality. Adrenals/Urinary Tract: New bilateral left-greater-than-right hydroureteronephrosis. 2 mm stone within the proximal left ureter. Additional bilateral renal calculi measuring up to 9 mm in the left lower pole. Irregular bladder wall thickening greatest about the left inferior portion of the bladder measuring up to 2.6 cm in thickness. Marked bladder distention. Partially calcified mass or debris along the posterior wall of the bladder. Unremarkable adrenal glands. Cortical scarring and renal cysts. Stomach/Bowel: Intrathoracic stomach. Organo-axial gastric volvulus with similar appearance. A loop of transverse colon is herniated through the hiatal hernia into the chest cavity. No evidence of obstruction. Colonic diverticulosis without evidence of diverticulitis. No bowel wall thickening or adjacent inflammatory change. Vascular/Lymphatic: Aortic atherosclerosis. No enlarged abdominal or pelvic lymph nodes. Reproductive: Enlarged prostate. The bladder wall thickening along the inferior aspect of the bladder appears confluent with the prostate. Other: No free intraperitoneal air or fluid. Fat containing bilateral inguinal hernias. Musculoskeletal: Similar compression fracture of L3 with increased compression deformity of L4. Retropulsion of the inferior endplate of L4 causes mild spinal canal narrowing. IMPRESSION: 1. Marked irregular bladder wall thickening along the inferior portion of the bladder extending superior along the left lateral sidewall.  The wall thickening is confluent with the prostate concerning for invasion by prostate carcinoma. The bladder is markedly distended and bladder outlet obstruction is not excluded. Further evaluation with cystoscopy may be helpful. 2. New left-greater-than-right hydroureteronephrosis. This is favored secondary to at least partial obstruction by the bladder wall mass. Nonobstructing renal calculi bilaterally. 3. Inverted stomach, organo-axial gastric volvulus with similar appearance to the prior study. 4. Herniation of a portion of the transverse colon into the chest without findings for obstruction. 5. Osteopenia with multiple new and increasing compression fractures in the thoracic and lumbar  spine as described. 6. Pulmonary findings of chronic bronchial infection/inflammation possibly secondary to aspiration. 7. 5 mm nodule along the right lower trachea could be adherent mucus though endobronchial lesion is not excluded. Consider further evaluation with bronchoscopy. 8. Aortic Atherosclerosis (ICD10-I70.0). These results were called by telephone at the time of interpretation on 05/30/2022 at 5:37 pm to provider [facility] , who verbally acknowledged these results. Electronically Signed   By: Placido Sou M.D.   On: 05/30/2022 17:42   DG Chest 1 View  Result Date: 05/30/2022 CLINICAL DATA:  Weakness. EXAM: CHEST  1 VIEW COMPARISON:  July 16, 2016 FINDINGS: Calcific atherosclerotic disease and tortuosity of the aorta. Displacement of the trachea to the right. Cardiomediastinal silhouette is normal. Mediastinal contours appear intact. Redemonstrated is large left diaphragmatic hernia containing parts of the stomach and transverse colon. Osseous structures are without acute abnormality. Soft tissues are grossly normal. IMPRESSION: 1. Displacement of the trachea to the right. 2. Large left diaphragmatic hernia containing parts of the stomach and transverse colon causes compressive atelectasis of the left  lower lobe. Electronically Signed   By: Fidela Salisbury M.D.   On: 05/30/2022 15:53   DG Forearm Right  Result Date: 05/30/2022 CLINICAL DATA:  Fall, pain EXAM: RIGHT ELBOW - COMPLETE 3+ VIEW; RIGHT FOREARM - 2 VIEW COMPARISON:  None Available. FINDINGS: There is no evidence of fracture, dislocation, or joint effusion. There is no evidence of arthropathy or other focal bone abnormality. Soft tissues are unremarkable. IMPRESSION: 1.  No fracture or dislocation of the right elbow or right forearm. 2. No elbow joint effusion to suggest radiographically occult fracture. Electronically Signed   By: Delanna Ahmadi M.D.   On: 05/30/2022 15:50   DG Elbow Complete Right  Result Date: 05/30/2022 CLINICAL DATA:  Fall, pain EXAM: RIGHT ELBOW - COMPLETE 3+ VIEW; RIGHT FOREARM - 2 VIEW COMPARISON:  None Available. FINDINGS: There is no evidence of fracture, dislocation, or joint effusion. There is no evidence of arthropathy or other focal bone abnormality. Soft tissues are unremarkable. IMPRESSION: 1.  No fracture or dislocation of the right elbow or right forearm. 2. No elbow joint effusion to suggest radiographically occult fracture. Electronically Signed   By: Delanna Ahmadi M.D.   On: 05/30/2022 15:50   CT Head Wo Contrast  Result Date: 05/30/2022 CLINICAL DATA:  88 year old male with head/neck trauma EXAM: CT HEAD WITHOUT CONTRAST CT CERVICAL SPINE WITHOUT CONTRAST TECHNIQUE: Multidetector CT imaging of the head and cervical spine was performed following the standard protocol without intravenous contrast. Multiplanar CT image reconstructions of the cervical spine were also generated. RADIATION DOSE REDUCTION: This exam was performed according to the departmental dose-optimization program which includes automated exposure control, adjustment of the mA and/or kV according to patient size and/or use of iterative reconstruction technique. COMPARISON:  None Available. FINDINGS: CT HEAD FINDINGS Brain: No intracranial  hemorrhage, mass effect, or evidence of acute infarct. No hydrocephalus. No extra-axial fluid collection. Advanced generalized cerebral atrophy. Ill-defined hypoattenuation within the cerebral white matter is nonspecific but consistent with chronic small vessel ischemic disease. Vascular: No hyperdense vessel or unexpected calcification. Skull: No fracture or focal lesion. Sinuses/Orbits: No acute finding. Paranasal sinuses and mastoid air cells are well aerated. Other: None. CT CERVICAL SPINE FINDINGS Alignment: Leftward tilting of the head neck is likely positional. No significant vertebral body subluxation. Skull base and vertebrae: Demineralization and positioning limit evaluation for subtle fractures. No fracture is identified. Soft tissues and spinal canal: Normal paravertebral soft tissues.  No visible spinal canal hematoma. Disc levels: Multilevel spondylosis disc space height loss and degenerative endplate changes greatest at C3-C4 and C6-C7 where it is advanced. No high-grade spinal canal. Uncovertebral spurring and facet arthropathy cause multilevel neural foraminal narrowing greatest C3-C4 it is moderate bilaterally. Upper chest: Biapical scarring. Other: Carotid bulb calcifications. IMPRESSION: 1. No acute intracranial abnormality. Generalized atrophy and small vessel white matter disease. 2. No acute fracture in the cervical spine. Multilevel degenerative spondylosis. Electronically Signed   By: Placido Sou M.D.   On: 05/30/2022 15:18   CT Cervical Spine Wo Contrast  Result Date: 05/30/2022 CLINICAL DATA:  88 year old male with head/neck trauma EXAM: CT HEAD WITHOUT CONTRAST CT CERVICAL SPINE WITHOUT CONTRAST TECHNIQUE: Multidetector CT imaging of the head and cervical spine was performed following the standard protocol without intravenous contrast. Multiplanar CT image reconstructions of the cervical spine were also generated. RADIATION DOSE REDUCTION: This exam was performed according to the  departmental dose-optimization program which includes automated exposure control, adjustment of the mA and/or kV according to patient size and/or use of iterative reconstruction technique. COMPARISON:  None Available. FINDINGS: CT HEAD FINDINGS Brain: No intracranial hemorrhage, mass effect, or evidence of acute infarct. No hydrocephalus. No extra-axial fluid collection. Advanced generalized cerebral atrophy. Ill-defined hypoattenuation within the cerebral white matter is nonspecific but consistent with chronic small vessel ischemic disease. Vascular: No hyperdense vessel or unexpected calcification. Skull: No fracture or focal lesion. Sinuses/Orbits: No acute finding. Paranasal sinuses and mastoid air cells are well aerated. Other: None. CT CERVICAL SPINE FINDINGS Alignment: Leftward tilting of the head neck is likely positional. No significant vertebral body subluxation. Skull base and vertebrae: Demineralization and positioning limit evaluation for subtle fractures. No fracture is identified. Soft tissues and spinal canal: Normal paravertebral soft tissues. No visible spinal canal hematoma. Disc levels: Multilevel spondylosis disc space height loss and degenerative endplate changes greatest at C3-C4 and C6-C7 where it is advanced. No high-grade spinal canal. Uncovertebral spurring and facet arthropathy cause multilevel neural foraminal narrowing greatest C3-C4 it is moderate bilaterally. Upper chest: Biapical scarring. Other: Carotid bulb calcifications. IMPRESSION: 1. No acute intracranial abnormality. Generalized atrophy and small vessel white matter disease. 2. No acute fracture in the cervical spine. Multilevel degenerative spondylosis. Electronically Signed   By: Placido Sou M.D.   On: 05/30/2022 15:18    I independently reviewed the above imaging studies.  Impression/Recommendation 1) Prostate cancer/abnormal prostate contour: Will check PSA to rule out possibility of recurrent prostate cancer and  a cause for his bladder outlet obstruction.  2) Urinary retention/bilateral hydronephrosis/AKI: Continue catheter drainage.  Would expect his renal function to improve and his hydronephrosis to resolve.  Would repeat renal imaging with a renal ultrasound once his renal function has stabilized.  Will need outpatient follow up with Dr. Louis Meckel to assess cause of his retention and options for management.  Dutch Gray 05/30/2022, 7:57 PM    Pryor Curia MD  CC: Dr. Zada Finders

## 2022-05-31 DIAGNOSIS — N139 Obstructive and reflux uropathy, unspecified: Secondary | ICD-10-CM | POA: Diagnosis not present

## 2022-05-31 DIAGNOSIS — N179 Acute kidney failure, unspecified: Secondary | ICD-10-CM | POA: Diagnosis not present

## 2022-05-31 LAB — CBC
HCT: 34.5 % — ABNORMAL LOW (ref 39.0–52.0)
Hemoglobin: 11.8 g/dL — ABNORMAL LOW (ref 13.0–17.0)
MCH: 30 pg (ref 26.0–34.0)
MCHC: 34.2 g/dL (ref 30.0–36.0)
MCV: 87.8 fL (ref 80.0–100.0)
Platelets: 124 10*3/uL — ABNORMAL LOW (ref 150–400)
RBC: 3.93 MIL/uL — ABNORMAL LOW (ref 4.22–5.81)
RDW: 14.6 % (ref 11.5–15.5)
WBC: 15.6 10*3/uL — ABNORMAL HIGH (ref 4.0–10.5)
nRBC: 0 % (ref 0.0–0.2)

## 2022-05-31 LAB — BASIC METABOLIC PANEL
Anion gap: 11 (ref 5–15)
BUN: 125 mg/dL — ABNORMAL HIGH (ref 8–23)
CO2: 20 mmol/L — ABNORMAL LOW (ref 22–32)
Calcium: 8.4 mg/dL — ABNORMAL LOW (ref 8.9–10.3)
Chloride: 100 mmol/L (ref 98–111)
Creatinine, Ser: 2.44 mg/dL — ABNORMAL HIGH (ref 0.61–1.24)
GFR, Estimated: 24 mL/min — ABNORMAL LOW (ref >60)
Glucose, Bld: 92 mg/dL (ref 70–99)
Potassium: 3.3 mmol/L — ABNORMAL LOW (ref 3.5–5.1)
Sodium: 131 mmol/L — ABNORMAL LOW (ref 135–145)

## 2022-05-31 LAB — URINE CULTURE

## 2022-05-31 LAB — MAGNESIUM: Magnesium: 2.5 mg/dL — ABNORMAL HIGH (ref 1.7–2.4)

## 2022-05-31 MED ORDER — FINASTERIDE 5 MG PO TABS
5.0000 mg | ORAL_TABLET | Freq: Every day | ORAL | Status: DC
  Administered 2022-05-31 – 2022-06-05 (×6): 5 mg via ORAL
  Filled 2022-05-31 (×6): qty 1

## 2022-05-31 MED ORDER — SODIUM CHLORIDE 0.9 % IV SOLN: INTRAVENOUS | Status: DC

## 2022-05-31 MED ORDER — PANTOPRAZOLE SODIUM 40 MG PO TBEC
40.0000 mg | DELAYED_RELEASE_TABLET | Freq: Every day | ORAL | Status: DC
  Administered 2022-05-31 – 2022-06-05 (×6): 40 mg via ORAL
  Filled 2022-05-31 (×6): qty 1

## 2022-05-31 MED ORDER — LEVOTHYROXINE SODIUM 112 MCG PO TABS
112.0000 ug | ORAL_TABLET | Freq: Every day | ORAL | Status: DC
  Administered 2022-05-31 – 2022-06-05 (×6): 112 ug via ORAL
  Filled 2022-05-31 (×5): qty 1

## 2022-05-31 MED ORDER — POTASSIUM CHLORIDE CRYS ER 10 MEQ PO TBCR
30.0000 meq | EXTENDED_RELEASE_TABLET | ORAL | Status: AC
Start: 2022-05-31 — End: 2022-05-31
  Administered 2022-05-31 (×2): 30 meq via ORAL
  Filled 2022-05-31 (×2): qty 1

## 2022-05-31 MED ORDER — TAMSULOSIN HCL 0.4 MG PO CAPS
0.4000 mg | ORAL_CAPSULE | Freq: Every day | ORAL | Status: DC
  Administered 2022-05-31 – 2022-06-05 (×6): 0.4 mg via ORAL
  Filled 2022-05-31 (×6): qty 1
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
## 2022-05-31 NOTE — Consult Note (Signed)
[facility] Nurse Consult Note: Reason for Consult: Consult requested for bilat buttocks/sacrum.  Pt has a dark red-purple Deep Tissue Pressure Injury (DTPI), approx 10X5cm, with a partial thickness red moist fissure to the inner gluteal fold related to previous moisture, approx 2X.1X.1cm.  DTPI are high risk to evolve into full thickness tissue loss.  No family members present to discuss this information.  ICD-10 CM Codes for Irritant Dermatitis L24A0 - Due to friction or contact with body fluids; L24A2 - Due to fecal, urinary or dual incontinence  Pressure Injury POA: Yes Dressing procedure/placement/frequency: Topical treatment orders provided for bedside nurses to perform to protect from further injury; Foam dressing to bilat buttocks/sacrum, change Q 3 days or PRN soiling.   Please re-consult if further assistance is needed.  Thank-you,  Julien Girt MSN, [place], [place], [place], [place]
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
## 2022-05-31 NOTE — Progress Notes (Signed)
Physical Therapy Evaluation Patient Details Name: Kevin Carr MRN: [id number] DOB: January 27, 1926 Today's Date: 05/31/2022  History of Present Illness  Kevin Carr is a 88 y.o. male who presented to the ED 05/30/22  for evaluation of generalized weakness and functional decline. Son states that 1 month ago patient has been living independently, able to drive, ambulate, and care for self. Over the last 3 weeks he has had significant functional decline with generalized weakness, lethargy, occasional confusion.  He has not been able to ambulate on his own for the last week.  PMH significant for remote prostate cancer, hypothyroidism, GERD, hiatal hernia with inverted stomach  Clinical Impression   Pt admitted secondary to problem above with deficits below. Four weeks PTA, pt was ambulatory (occasionally with RW) and able to drive.  Pt currently requires mod-total assist for bed mobility and due to lethargy was unable to stand. Patient with significant functional decline and will need incr time with therapies to regain independence.  Anticipate patient will benefit from PT to address problems listed below.Will continue to follow acutely to maximize functional mobility independence and safety.          Recommendations for follow up therapy are one component of a multi-disciplinary discharge planning process, led by the attending physician.  Recommendations may be updated based on patient status, additional functional criteria and insurance authorization.  Follow Up Recommendations Skilled nursing-short term rehab (<3 hours/day) Can patient physically be transported by private vehicle: No    Assistance Recommended at Discharge Frequent or constant Supervision/Assistance  Patient can return home with the following  Two people to help with walking and/or transfers;Two people to help with bathing/dressing/bathroom;Assistance with cooking/housework;Direct supervision/assist for medications  management;Direct supervision/assist for financial management;Assist for transportation;Help with stairs or ramp for entrance    Equipment Recommendations Wheelchair (measurements PT);Wheelchair cushion (measurements PT);BSC/3in1  Recommendations for Other Services       Functional Status Assessment Patient has had a recent decline in their functional status and demonstrates the ability to make significant improvements in function in a reasonable and predictable amount of time.     Precautions / Restrictions Precautions Precautions: Fall;Back Precaution Comments: catheter Restrictions Weight Bearing Restrictions: No      Mobility  Bed Mobility Overal bed mobility: Needs Assistance Bed Mobility: Rolling, Sidelying to Sit, Sit to Sidelying Rolling: Mod assist Sidelying to sit: Max assist     Sit to sidelying: Total assist General bed mobility comments: Heavy assist to bring trunk upright. [place] leaning on to left elbow. Eventually got him into midline but patient propping elbows on knees and neck flexed.    Transfers                   General transfer comment: unable to stay awake enough to attempt standing    Ambulation/Gait                  Stairs            Wheelchair Mobility    Modified Rankin (Stroke Patients Only)       Balance Overall balance assessment: Needs assistance Sitting-balance support: Single extremity supported Sitting balance-Leahy Scale: Poor                                       Pertinent Vitals/Pain Pain Assessment Pain Assessment: Faces Faces Pain Scale: Hurts little more Pain Location: neck Pain Descriptors /  Indicators: Grimacing Pain Intervention(s): Limited activity within patient's tolerance, Monitored during session, Repositioned    Home Living Family/patient expects to be discharged to:: Private residence Living Arrangements: Children Available Help at Discharge: Family;Available 24  hours/day Type of Home: House       Alternate Level Stairs-Number of Steps: (P) family has ordered a stair lift Home Layout: (P) Two level Home Equipment: (P) Rolling Walker (2 wheels);Rollator (4 wheels);Shower seat Additional Comments: pt unable to provide additional information as he continues to fall asleep    Prior Function Prior Level of Function : Needs assist             Mobility Comments: uses walker sometime ADLs Comments: needed help with ADLs for the last week     Hand Dominance   Dominant Hand: (P) Right    Extremity/Trunk Assessment   Upper Extremity Assessment Upper Extremity Assessment: Defer to OT evaluation    Lower Extremity Assessment Lower Extremity Assessment: Generalized weakness (bil LE edema with ace wraps to lower legs)    Cervical / Trunk Assessment Cervical / Trunk Assessment: Kyphotic  Communication   Communication: No difficulties  Cognition Arousal/Alertness: Lethargic Behavior During Therapy: Flat affect Overall Cognitive Status: Difficult to assess                                          General Comments      Exercises Other Exercises Other Exercises: AAROM bil LEs in supine to try to incr alertness   Assessment/Plan    PT Assessment Patient needs continued PT services  PT Problem List Decreased strength;Decreased activity tolerance;Decreased balance;Decreased mobility;Decreased knowledge of use of DME;Pain       PT Treatment Interventions DME instruction;Gait training;Functional mobility training;Therapeutic activities;Therapeutic exercise;Balance training;Patient/family education    PT Goals (Current goals can be found in the Care Plan section)  Acute Rehab PT Goals Patient Stated Goal: agrees wants to get stronger PT Goal Formulation: With patient Time For Goal Achievement: 06/14/22 Potential to Achieve Goals: Fair    Frequency Min 2X/week     Co-evaluation               AM-PAC PT "6  Clicks" Mobility  Outcome Measure Help needed turning from your back to your side while in a flat bed without using bedrails?: A Lot Help needed moving from lying on your back to sitting on the side of a flat bed without using bedrails?: Total Help needed moving to and from a bed to a chair (including a wheelchair)?: Total Help needed standing up from a chair using your arms (e.g., wheelchair or bedside chair)?: Total Help needed to walk in hospital room?: Total Help needed climbing 3-5 steps with a railing? : Total 6 Click Score: 7    End of Session   Activity Tolerance: Patient limited by lethargy Patient left: in bed;with call bell/phone within reach;with bed alarm set Nurse Communication: Mobility status PT Visit Diagnosis: Muscle weakness (generalized) (M62.81);Difficulty in walking, not elsewhere classified (R26.2);Adult, failure to thrive (R62.7)    Time: 1110-1131 PT Time Calculation (min) (ACUTE ONLY): 21 min   Charges:   PT Evaluation $PT Eval Low Complexity: [place], PT Acute Rehabilitation Services  Office [phone]   Rexanne Mano 05/31/2022, 11:45 AM

## 2022-06-01 DIAGNOSIS — N139 Obstructive and reflux uropathy, unspecified: Secondary | ICD-10-CM | POA: Diagnosis not present

## 2022-06-01 DIAGNOSIS — N179 Acute kidney failure, unspecified: Secondary | ICD-10-CM | POA: Diagnosis not present

## 2022-06-01 LAB — BASIC METABOLIC PANEL
Anion gap: 6 (ref 5–15)
BUN: 76 mg/dL — ABNORMAL HIGH (ref 8–23)
CO2: 22 mmol/L (ref 22–32)
Calcium: 8.4 mg/dL — ABNORMAL LOW (ref 8.9–10.3)
Chloride: 106 mmol/L (ref 98–111)
Creatinine, Ser: 1.43 mg/dL — ABNORMAL HIGH (ref 0.61–1.24)
GFR, Estimated: 45 mL/min — ABNORMAL LOW (ref >60)
Glucose, Bld: 112 mg/dL — ABNORMAL HIGH (ref 70–99)
Potassium: 3.5 mmol/L (ref 3.5–5.1)
Sodium: 134 mmol/L — ABNORMAL LOW (ref 135–145)

## 2022-06-01 LAB — CBC
HCT: 30.9 % — ABNORMAL LOW (ref 39.0–52.0)
Hemoglobin: 10.6 g/dL — ABNORMAL LOW (ref 13.0–17.0)
MCH: 30.7 pg (ref 26.0–34.0)
MCHC: 34.3 g/dL (ref 30.0–36.0)
MCV: 89.6 fL (ref 80.0–100.0)
Platelets: 143 10*3/uL — ABNORMAL LOW (ref 150–400)
RBC: 3.45 MIL/uL — ABNORMAL LOW (ref 4.22–5.81)
RDW: 15.6 % — ABNORMAL HIGH (ref 11.5–15.5)
WBC: 15.4 10*3/uL — ABNORMAL HIGH (ref 4.0–10.5)
nRBC: 0 % (ref 0.0–0.2)

## 2022-06-01 LAB — MAGNESIUM: Magnesium: 2.5 mg/dL — ABNORMAL HIGH (ref 1.7–2.4)

## 2022-06-01 MED ORDER — POTASSIUM CHLORIDE CRYS ER 20 MEQ PO TBCR
40.0000 meq | EXTENDED_RELEASE_TABLET | Freq: Once | ORAL | Status: AC
  Administered 2022-06-01: 40 meq via ORAL
  Filled 2022-06-01: qty 2

## 2022-06-01 MED ORDER — CHLORHEXIDINE GLUCONATE CLOTH 2 % EX PADS
6.0000 | MEDICATED_PAD | Freq: Every day | CUTANEOUS | Status: DC
  Administered 2022-06-01 – 2022-06-04 (×4): 6 via TOPICAL

## 2022-06-01 NOTE — TOC Initial Note (Signed)
Transition of Care [facility]) - Initial/Assessment Note    Patient Details  Name: Kevin Carr MRN: [id number] Date of Birth: 1925-12-17  Transition of Care [facility]) CM/SW Contact:    Milinda Antis, [place] Phone Number: 06/01/2022, 3:17 PM  Clinical Narrative:                 CSW received consult for possible SNF placement at time of discharge. CSW spoke with patient at bedside. Patient expressed understanding of PT recommendation and is agreeable to SNF placement at time of discharge. Patient reports preference for a facility in [place]. CSW discussed insurance authorization process and will provide Medicare SNF ratings list. CSW will send out referrals for review and provide bed offers as available.   Skilled Nursing Rehab Facilities-   [email or web address]   Ratings out of 5 stars (the highest)   Name Address  Phone # [place] Inspection Overall  [place] [address], [place] '5 5 2 4  '$ [facility]  [address], [place] [phone] '4 2 5 5  '$ [facility] [place], [place] '1 1 1 1  '$ [place], [place] '2 2 4 4  '$ [facility] [address] [place] '1 1 2 1  '$ [place] [place] '3 2 4 4  '$ [facility] [address] [place] '5 1 3 3  '$ [facility] [address] [place] '5 2 3 4  '$ [address] ([place]) [place], [address] [phone] '4 1 2 1  '$ [facility] [phone], [address] [phone] '4 1 1 1  '$ [facility] [address] [facility] [phone] '3 1 2 1  '$ [facility] [place]. [address], [place] [phone] '4 1 1 1  '$ [place] [address] [place] [address] '4 2 4 4          '$ [place] [address] [place] '4 1 3 2  '$ [facility] [place] [address] [place] '3 1 5 4  '$ [facility], [place], [address] [phone] '1 1 1 1  '$ [facility] [address] [facility] [phone] '2 2 3 3          '$ [address] (no [facility]) [place] [facility], [place] [phone] '5 5 5 5  '$ [facility] (No Hum[employer]) [address], [place] '4 1 4 3  '$ [facility] (No [facility]) [place], [place] '5 5 5 5  '$ [facility] [address] [facility] [phone] '2 3 5 5  '$ [place] [address] [place] '1 1 2 1  '$ [place] [address], [address] '3 1 1 1  '$ [place], [place] '5 2 4 5  '$ [facility]  [address], [place] '2 2 1 1  '$ [facility] [address] [place] '3 2 1 1  '$ [facility] [place], [place] '2 2 2 2          '$ [facility] [address] [place] [phone] '1 1 1 1  '$ [place] [address] [phone]  [phone] '2 4 2 2  '$ [facility] [place] [address], [phone] '4 2 3 3  '$ [place],  [place] [phone] '2 1 1 1  '$ [place] (No Humana) [address], [place] [phone] '2 2 3 3  '$ [facility] [address] [place] [phone] '2 1 1 1          '$ [facility] [place], [place] '5 4 5 5  '$ [facility])  [address], [place] '2 1 2 1  '$ [facility]) [place] [address] [place] '3 1 4 3  '$ [place] [address] [place] '3 3 4 4  '$ [address] [place], [place] '2 3 1 1  '$ [place] [facility]) [address] [place] [phone] '2 1 4 3     '$ Expected Discharge Plan: [place] Barriers to Discharge: Insurance Authorization, SNF Pending bed offer   Patient Goals and CMS Choice Patient states their goals for this hospitalization and ongoing recovery are:: To go to rehab CMS Medicare.gov Compare Post Acute Care list provided to:: Patient Choice offered to / list presented to : Patient  Expected Discharge Plan and Services Expected Discharge Plan: [place]       Living arrangements for the past 2 months: Single Family Home                                      Prior Living Arrangements/Services Living arrangements for the past 2 months: Single Family Home Lives with:: Adult Children Patient language and need for interpreter reviewed:: Yes Do you feel safe going back to the place where you live?: Yes      Need for Family Participation in Patient Care: Yes (Comment) Care giver support system in place?: Yes (comment)   Criminal Activity/Legal Involvement Pertinent to Current Situation/Hospitalization: No - Comment as needed  Activities of Daily Living      Permission Sought/Granted   Permission granted to share information with : Yes, Verbal Permission Granted     Permission granted to share info w AGENCY: SNF        Emotional Assessment Appearance:: Appears stated age Attitude/Demeanor/Rapport: Engaged Affect (typically observed): Guarded, Adaptable Orientation: : Oriented to Situation, Oriented to  Time, Oriented to Place, Oriented to Self Alcohol / Substance Use: Not Applicable Psych Involvement: No (comment)  Admission diagnosis:  Acute renal failure due to urinary obstruction ([place]) [N17.9, N13.9] Patient Active Problem List   Diagnosis Date Noted   Acute renal failure due to urinary obstruction ([place]) 05/30/2022   Hyponatremia 05/30/2022   Hypothyroidism 05/30/2022   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 [place] [place] [address] Phone: [phone] Fax: [place] [address] [place] [address] [place] [address] Phone: [phone] Fax: [place], [place] [address] [place] [address] Phone: [phone] Fax: [phone]     Social Determinants of Health (SDOH) Interventions    Readmission Risk Interventions     No data to display

## 2022-06-01 NOTE — Care Management Important Message (Signed)
Important Message  Patient Details  Name: Kevin Carr MRN: [id number] Date of Birth: 03/20/1926   Medicare Important Message Given:  Yes     Orbie Pyo 06/01/2022, 2:24 PM
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
## 2022-06-01 NOTE — NC FL2 (Signed)
[place] LEVEL OF CARE SCREENING TOOL     IDENTIFICATION  Patient Name: Kevin Carr Birthdate: 10-27-1926 Sex: male Admission Date (Current Location): 05/30/2022  [facility] and [place] Number:  Herbalist and Address:  [facility] [place], [place] [address], [place], [place] [address]      Provider Number: [id number]  Attending Physician Name and Address:  [place] Eric J, DO  Relative Name and Phone Number:  Marsel, Gail (Son)   [phone]    Current Level of Care: Hospital Recommended Level of Care: [place] Prior Approval Number:    Date Approved/Denied:   PASRR Number: [id number]  Discharge Plan: SNF    Current Diagnoses: Patient Active Problem List   Diagnosis Date Noted   Acute renal failure due to urinary obstruction ([place]) 05/30/2022   Hyponatremia 05/30/2022   Hypothyroidism 05/30/2022   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   Bilateral hydroureteronephrosis 05/30/2022   Thoracic compression fracture ([place]) 05/30/2022   Uremic encephalopathy 05/30/2022   UTI (urinary tract infection) 05/30/2022    Orientation RESPIRATION BLADDER Height & Weight     Self, Time, Place  Normal Incontinent Weight: 190 lb (86.2 kg) Height:  '5\' 11"'$  (180.3 cm)  BEHAVIORAL SYMPTOMS/MOOD NEUROLOGICAL BOWEL NUTRITION STATUS      Incontinent Diet (see d/c summary)  AMBULATORY STATUS COMMUNICATION OF NEEDS Skin   Total Care Verbally PU Stage and Appropriate Care (left sacrum deep tissue)                       Personal Care Assistance Level of Assistance  Bathing, Feeding, Dressing Bathing Assistance: Maximum assistance Feeding assistance: Limited assistance Dressing Assistance: Maximum assistance     Functional Limitations Info  Sight, Hearing, Speech Sight Info: Adequate Hearing Info: Adequate Speech Info: Adequate    SPECIAL CARE FACTORS FREQUENCY  PT (By licensed PT), OT (By licensed OT)     PT Frequency: 5x/  week OT Frequency: 5x/ week            Contractures Contractures Info: Not present    Additional Factors Info  Code Status, Allergies Code Status Info: Full Allergies Info: NKA           Current Medications (06/01/2022):  This is the current hospital active medication list Current Facility-Administered Medications  Medication Dose Route Frequency Provider Last Rate Last Admin   acetaminophen (TYLENOL) tablet 650 mg  650 mg Oral Q6H PRN Lenore Cordia, MD   650 mg at 05/31/22 2255   Or   acetaminophen (TYLENOL) suppository 650 mg  650 mg Rectal Q6H PRN Lenore Cordia, MD       cefTRIAXone (ROCEPHIN) 1 g in sodium chloride 0.9 % 100 mL IVPB  1 g Intravenous Q24H Zada Finders R, MD 200 mL/hr at 05/31/22 1707 1 g at 05/31/22 1707   Chlorhexidine Gluconate Cloth 2 % PADS 6 each  6 each Topical Daily [place] Eric J, DO   6 each at 06/01/22 1309   finasteride (PROSCAR) tablet 5 mg  5 mg Oral Daily [place] Donnamarie Poag, DO   5 mg at 06/01/22 1041   heparin injection 5,000 Units  5,000 Units Subcutaneous Q8H Lenore Cordia, MD   5,000 Units at 06/01/22 1309   levothyroxine (SYNTHROID) tablet 112 mcg  112 mcg Oral Daily [place] Eric J, DO   112 mcg at 06/01/22 0600   ondansetron (ZOFRAN) tablet 4 mg  4 mg Oral Q6H PRN Zada Finders  R, MD       Or   ondansetron (ZOFRAN) injection 4 mg  4 mg Intravenous Q6H PRN Lenore Cordia, MD       pantoprazole (PROTONIX) EC tablet 40 mg  40 mg Oral Daily [place] Donnamarie Poag, DO   40 mg at 06/01/22 1041   senna-docusate (Senokot-S) tablet 1 tablet  1 tablet Oral QHS PRN Lenore Cordia, MD   1 tablet at 05/31/22 2255   sodium chloride flush (NS) 0.9 % injection 3 mL  3 mL Intravenous Q12H Zada Finders R, MD   3 mL at 06/01/22 1044   tamsulosin (FLOMAX) capsule 0.4 mg  0.4 mg Oral Daily [place] Donnamarie Poag, DO   0.4 mg at 06/01/22 1041     Discharge Medications: Please see discharge summary for a list of discharge medications.  Relevant Imaging  Results:  Relevant Lab Results:   Additional Information SSN: [id number]; 5'11" 190lbs  Nnamdi Dacus F Kylil Swopes, LCSWA

## 2022-06-02 DIAGNOSIS — N179 Acute kidney failure, unspecified: Secondary | ICD-10-CM | POA: Diagnosis not present

## 2022-06-02 DIAGNOSIS — N139 Obstructive and reflux uropathy, unspecified: Secondary | ICD-10-CM | POA: Diagnosis not present

## 2022-06-02 LAB — BASIC METABOLIC PANEL
Anion gap: 8 (ref 5–15)
BUN: 48 mg/dL — ABNORMAL HIGH (ref 8–23)
CO2: 21 mmol/L — ABNORMAL LOW (ref 22–32)
Calcium: 8.2 mg/dL — ABNORMAL LOW (ref 8.9–10.3)
Chloride: 103 mmol/L (ref 98–111)
Creatinine, Ser: 1.16 mg/dL (ref 0.61–1.24)
GFR, Estimated: 58 mL/min — ABNORMAL LOW (ref >60)
Glucose, Bld: 104 mg/dL — ABNORMAL HIGH (ref 70–99)
Potassium: 4 mmol/L (ref 3.5–5.1)
Sodium: 132 mmol/L — ABNORMAL LOW (ref 135–145)

## 2022-06-02 LAB — CBC
HCT: 30.4 % — ABNORMAL LOW (ref 39.0–52.0)
Hemoglobin: 10.4 g/dL — ABNORMAL LOW (ref 13.0–17.0)
MCH: 30.6 pg (ref 26.0–34.0)
MCHC: 34.2 g/dL (ref 30.0–36.0)
MCV: 89.4 fL (ref 80.0–100.0)
Platelets: 146 10*3/uL — ABNORMAL LOW (ref 150–400)
RBC: 3.4 MIL/uL — ABNORMAL LOW (ref 4.22–5.81)
RDW: 15.8 % — ABNORMAL HIGH (ref 11.5–15.5)
WBC: 16.1 10*3/uL — ABNORMAL HIGH (ref 4.0–10.5)
nRBC: 0 % (ref 0.0–0.2)

## 2022-06-02 LAB — MAGNESIUM: Magnesium: 2.1 mg/dL (ref 1.7–2.4)

## 2022-06-02 MED ORDER — SENNOSIDES-DOCUSATE SODIUM 8.6-50 MG PO TABS
1.0000 | ORAL_TABLET | Freq: Two times a day (BID) | ORAL | Status: DC
  Administered 2022-06-02 – 2022-06-05 (×6): 1 via ORAL
  Filled 2022-06-02 (×6): qty 1

## 2022-06-02 MED ORDER — POLYETHYLENE GLYCOL 3350 17 G PO PACK: 17.0000 g | PACK | Freq: Every day | ORAL | Status: DC | PRN
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
## 2022-06-03 DIAGNOSIS — N179 Acute kidney failure, unspecified: Secondary | ICD-10-CM | POA: Diagnosis not present

## 2022-06-03 DIAGNOSIS — N139 Obstructive and reflux uropathy, unspecified: Secondary | ICD-10-CM | POA: Diagnosis not present

## 2022-06-03 LAB — CBC
HCT: 31.4 % — ABNORMAL LOW (ref 39.0–52.0)
Hemoglobin: 10.6 g/dL — ABNORMAL LOW (ref 13.0–17.0)
MCH: 30.5 pg (ref 26.0–34.0)
MCHC: 33.8 g/dL (ref 30.0–36.0)
MCV: 90.2 fL (ref 80.0–100.0)
Platelets: 166 10*3/uL (ref 150–400)
RBC: 3.48 MIL/uL — ABNORMAL LOW (ref 4.22–5.81)
RDW: 15.9 % — ABNORMAL HIGH (ref 11.5–15.5)
WBC: 17.8 10*3/uL — ABNORMAL HIGH (ref 4.0–10.5)
nRBC: 0 % (ref 0.0–0.2)

## 2022-06-03 LAB — BASIC METABOLIC PANEL
Anion gap: 7 (ref 5–15)
BUN: 30 mg/dL — ABNORMAL HIGH (ref 8–23)
CO2: 21 mmol/L — ABNORMAL LOW (ref 22–32)
Calcium: 8.1 mg/dL — ABNORMAL LOW (ref 8.9–10.3)
Chloride: 103 mmol/L (ref 98–111)
Creatinine, Ser: 1.02 mg/dL (ref 0.61–1.24)
GFR, Estimated: >60 mL/min (ref >60)
Glucose, Bld: 136 mg/dL — ABNORMAL HIGH (ref 70–99)
Potassium: 4.3 mmol/L (ref 3.5–5.1)
Sodium: 131 mmol/L — ABNORMAL LOW (ref 135–145)

## 2022-06-03 MED ORDER — SODIUM CHLORIDE 0.9 % IV SOLN: INTRAVENOUS | Status: AC
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
## 2022-06-03 NOTE — Progress Notes (Signed)
Current SNF offers provided to pt's son Kip via email [email or web address]. Will need to update Navi with facility choice when known.   Wandra Feinstein, MSW, LCSW [phone] (coverage)

## 2022-06-04 DIAGNOSIS — N179 Acute kidney failure, unspecified: Secondary | ICD-10-CM | POA: Diagnosis not present

## 2022-06-04 DIAGNOSIS — N139 Obstructive and reflux uropathy, unspecified: Secondary | ICD-10-CM | POA: Diagnosis not present

## 2022-06-04 LAB — BASIC METABOLIC PANEL
Anion gap: 8 (ref 5–15)
BUN: 26 mg/dL — ABNORMAL HIGH (ref 8–23)
CO2: 20 mmol/L — ABNORMAL LOW (ref 22–32)
Calcium: 8.4 mg/dL — ABNORMAL LOW (ref 8.9–10.3)
Chloride: 104 mmol/L (ref 98–111)
Creatinine, Ser: 1.35 mg/dL — ABNORMAL HIGH (ref 0.61–1.24)
GFR, Estimated: 49 mL/min — ABNORMAL LOW (ref >60)
Glucose, Bld: 103 mg/dL — ABNORMAL HIGH (ref 70–99)
Potassium: 4.5 mmol/L (ref 3.5–5.1)
Sodium: 132 mmol/L — ABNORMAL LOW (ref 135–145)

## 2022-06-04 LAB — CBC
HCT: 37.1 % — ABNORMAL LOW (ref 39.0–52.0)
Hemoglobin: 11.9 g/dL — ABNORMAL LOW (ref 13.0–17.0)
MCH: 29.9 pg (ref 26.0–34.0)
MCHC: 32.1 g/dL (ref 30.0–36.0)
MCV: 93.2 fL (ref 80.0–100.0)
Platelets: 201 10*3/uL (ref 150–400)
RBC: 3.98 MIL/uL — ABNORMAL LOW (ref 4.22–5.81)
RDW: 16.3 % — ABNORMAL HIGH (ref 11.5–15.5)
WBC: 19.1 10*3/uL — ABNORMAL HIGH (ref 4.0–10.5)
nRBC: 0 % (ref 0.0–0.2)

## 2022-06-04 LAB — CULTURE, BLOOD (ROUTINE X 2)
Culture: NO GROWTH
Culture: NO GROWTH

## 2022-06-04 LAB — MAGNESIUM: Magnesium: 2.1 mg/dL (ref 1.7–2.4)

## 2022-06-04 NOTE — Progress Notes (Signed)
Physical Therapy Treatment Patient Details Name: Kevin Carr MRN: [id number] DOB: Apr 26, 1926 Today's Date: 06/04/2022   History of Present Illness Kevin Carr is a 88 y.o. male who presented to the ED 05/30/22  for evaluation of generalized weakness and functional decline. Son states that 1 month ago patient has been living independently, able to drive, ambulate, and care for self. Over the last 3 weeks he has had significant functional decline with generalized weakness, lethargy, occasional confusion.  He has not been able to ambulate on his own for the last week. New compression fractures of T6 and T8; acute renal failure; UTI; encephalopathy  PMH significant for remote prostate cancer, hypothyroidism, GERD, hiatal hernia with inverted stomach    PT Comments    Patient initially agreeable to PT session and participating through LE exercises and progression to sitting EOB. Patient refusing to attempt standing from EOB due to fear of falling. Multiple reassurances given that pt was safe with 2 people assisting him, however he continued to refuse    Recommendations for follow up therapy are one component of a multi-disciplinary discharge planning process, led by the attending physician.  Recommendations may be updated based on patient status, additional functional criteria and insurance authorization.  Follow Up Recommendations  Skilled nursing-short term rehab (<3 hours/day) Can patient physically be transported by private vehicle: No   Assistance Recommended at Discharge Frequent or constant Supervision/Assistance  Patient can return home with the following Two people to help with walking and/or transfers;Two people to help with bathing/dressing/bathroom;Assistance with cooking/housework;Direct supervision/assist for medications management;Direct supervision/assist for financial management;Assist for transportation;Help with stairs or ramp for entrance   Equipment Recommendations   Wheelchair (measurements PT);Wheelchair cushion (measurements PT);BSC/3in1    Recommendations for Other Services       Precautions / Restrictions Precautions Precautions: Fall Precaution Comments: catheter Restrictions Weight Bearing Restrictions: No     Mobility  Bed Mobility Overal bed mobility: Needs Assistance Bed Mobility: Rolling, Sidelying to Sit, Sit to Sidelying Rolling: Mod assist Sidelying to sit: Max assist     Sit to sidelying: Total assist, +2 for physical assistance General bed mobility comments: Heavy assist to bring trunk upright. Patinet leaning on to left elbow. Eventually got him into midline but patient propping elbows on knees and neck flexed.    Transfers                   General transfer comment: Attempted to have pt stand with pt repeatedly refusing due to fear of falling. Unable to persuade pt to attempt.    Ambulation/Gait                   Stairs             Wheelchair Mobility    Modified Rankin (Stroke Patients Only)       Balance Overall balance assessment: Needs assistance Sitting-balance support: Single extremity supported Sitting balance-Leahy Scale: Poor                                      Cognition Arousal/Alertness: Awake/alert Behavior During Therapy: Flat affect Overall Cognitive Status: Difficult to assess                                 General Comments: Initially drowsy during evaluation but able to follow commands and answer  questions appropriatley. Alert to self and birthdate.        Exercises Other Exercises Other Exercises: AAROM bil LEs in supine initially to try to incr alertness and ROM    General Comments        Pertinent Vitals/Pain Pain Assessment Pain Assessment: Faces Faces Pain Scale: Hurts little more Pain Location: neck Pain Descriptors / Indicators: Grimacing Pain Intervention(s): Limited activity within patient's tolerance,  Repositioned    Home Living                          Prior Function            PT Goals (current goals can now be found in the care plan section) Acute Rehab PT Goals Patient Stated Goal: agrees wants to get stronger Time For Goal Achievement: 06/14/22 Potential to Achieve Goals: Fair Progress towards PT goals: Not progressing toward goals - comment (fearful of falling and fatigue)    Frequency    Min 2X/week      PT Plan Current plan remains appropriate    Co-evaluation              AM-PAC PT "6 Clicks" Mobility   Outcome Measure  Help needed turning from your back to your side while in a flat bed without using bedrails?: A Lot Help needed moving from lying on your back to sitting on the side of a flat bed without using bedrails?: Total Help needed moving to and from a bed to a chair (including a wheelchair)?: Total Help needed standing up from a chair using your arms (e.g., wheelchair or bedside chair)?: Total Help needed to walk in hospital room?: Total Help needed climbing 3-5 steps with a railing? : Total 6 Click Score: 7    End of Session Equipment Utilized During Treatment: Gait belt Activity Tolerance: Patient limited by fatigue Patient left: in bed;with call bell/phone within reach;with bed alarm set Nurse Communication: Mobility status PT Visit Diagnosis: Muscle weakness (generalized) (M62.81);Difficulty in walking, not elsewhere classified (R26.2);Adult, failure to thrive (R62.7)     Time: 2111-5520 PT Time Calculation (min) (ACUTE ONLY): 19 min  Charges:  $Therapeutic Activity: 8-22 mins                      Arby Barrette, PT Acute Rehabilitation Services  Office [phone]    Rexanne Mano 06/04/2022, 12:29 PM
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
## 2022-06-04 NOTE — TOC Progression Note (Addendum)
Transition of Care [facility]) - Initial/Assessment Note    Patient Details  Name: Kevin Carr MRN: [id number] Date of Birth: Aug 31, 1926  Transition of [facility] CM/SW Contact:    Milinda Antis, [place] Phone Number: 06/04/2022, 8:44 AM  Clinical Narrative:                 CSW notified that the family is requesting [facility].  CSW contacted Olivia Mackie in admissions at the facility and is awaiting a response.  09:00- CSW informed that [facility] SN[facility] is unable to accept the patient.  09:30-  CSW contacted patient's son to provide update and receive new SNF choice.  [facility] was chosen.  CSW contacted Kirsten at [facility] and secured bed offer.  Insurance auth updated to show facility.  11:46-  Insurance authorization approved.  CSW contacted [facility] to inform of approval.  The facility can accept the patient tomorrow.    TOC will continue to follow and plan for d/c tomorrow.     Expected Discharge Plan: Skilled Nursing Facility Barriers to Discharge: Insurance Authorization, SNF Pending bed offer   Patient Goals and CMS Choice Patient states their goals for this hospitalization and ongoing recovery are:: To go to rehab CMS Medicare.gov Compare Post Acute Care list provided to:: Patient Choice offered to / list presented to : Patient  Expected Discharge Plan and Services Expected Discharge Plan: [place]       Living arrangements for the past 2 months: Single Family Home                                      Prior Living Arrangements/Services Living arrangements for the past 2 months: Single Family Home Lives with:: Adult Children Patient language and need for interpreter reviewed:: Yes Do you feel safe going back to the place where you live?: Yes      Need for Family Participation in Patient Care: Yes (Comment) Care giver support system in place?: Yes (comment)   Criminal Activity/Legal Involvement Pertinent to Current  Situation/Hospitalization: No - Comment as needed  Activities of Daily Living      Permission Sought/Granted   Permission granted to share information with : Yes, Verbal Permission Granted     Permission granted to share info w AGENCY: SNF        Emotional Assessment Appearance:: Appears stated age Attitude/Demeanor/Rapport: Engaged Affect (typically observed): Guarded, Adaptable Orientation: : Oriented to Situation, Oriented to  Time, Oriented to Place, Oriented to Self Alcohol / Substance Use: Not Applicable Psych Involvement: No (comment)  Admission diagnosis:  Acute renal failure due to urinary obstruction ([place]) [N17.9, N13.9] Patient Active Problem List   Diagnosis Date Noted   Acute renal failure due to urinary obstruction ([place]) 05/30/2022   Hyponatremia 05/30/2022   Hypothyroidism 05/30/2022   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   Bilateral hydroureteronephrosis 05/30/2022   Thoracic compression fracture ([place]) 05/30/2022   Uremic encephalopathy 05/30/2022   UTI (urinary tract infection) 05/30/2022   PCP:  Lujean Amel, MD Pharmacy:   [place], [place] - [address] [place] [place] [address] Phone: [phone] Fax: [facility] [address], [place] [address] [place] [address] Phone: [phone] Fax: [place], [place] - [address] [place] [address] Phone: [phone] Fax: [phone]  Social Determinants of Health (SDOH) Interventions    Readmission Risk Interventions     No data to display           

## 2022-06-05 DIAGNOSIS — E039 Hypothyroidism, unspecified: Secondary | ICD-10-CM | POA: Diagnosis not present

## 2022-06-05 DIAGNOSIS — Z09 Encounter for follow-up examination after completed treatment for conditions other than malignant neoplasm: Secondary | ICD-10-CM | POA: Diagnosis not present

## 2022-06-05 DIAGNOSIS — G479 Sleep disorder, unspecified: Secondary | ICD-10-CM | POA: Diagnosis not present

## 2022-06-05 DIAGNOSIS — R8271 Bacteriuria: Secondary | ICD-10-CM | POA: Diagnosis not present

## 2022-06-05 DIAGNOSIS — N130 Hydronephrosis with ureteropelvic junction obstruction: Secondary | ICD-10-CM | POA: Diagnosis not present

## 2022-06-05 DIAGNOSIS — K562 Volvulus: Secondary | ICD-10-CM | POA: Diagnosis not present

## 2022-06-05 DIAGNOSIS — N19 Unspecified kidney failure: Secondary | ICD-10-CM | POA: Diagnosis not present

## 2022-06-05 DIAGNOSIS — Z7401 Bed confinement status: Secondary | ICD-10-CM | POA: Diagnosis not present

## 2022-06-05 DIAGNOSIS — N1330 Unspecified hydronephrosis: Secondary | ICD-10-CM | POA: Diagnosis not present

## 2022-06-05 DIAGNOSIS — L89156 Pressure-induced deep tissue damage of sacral region: Secondary | ICD-10-CM | POA: Diagnosis not present

## 2022-06-05 DIAGNOSIS — L8945 Pressure ulcer of contiguous site of back, buttock and hip, unstageable: Secondary | ICD-10-CM | POA: Diagnosis not present

## 2022-06-05 DIAGNOSIS — Z960 Presence of urogenital implants: Secondary | ICD-10-CM | POA: Diagnosis not present

## 2022-06-05 DIAGNOSIS — N201 Calculus of ureter: Secondary | ICD-10-CM | POA: Diagnosis not present

## 2022-06-05 DIAGNOSIS — Z743 Need for continuous supervision: Secondary | ICD-10-CM | POA: Diagnosis not present

## 2022-06-05 DIAGNOSIS — S22000D Wedge compression fracture of unspecified thoracic vertebra, subsequent encounter for fracture with routine healing: Secondary | ICD-10-CM | POA: Diagnosis not present

## 2022-06-05 DIAGNOSIS — E871 Hypo-osmolality and hyponatremia: Secondary | ICD-10-CM | POA: Diagnosis not present

## 2022-06-05 DIAGNOSIS — M6281 Muscle weakness (generalized): Secondary | ICD-10-CM | POA: Diagnosis not present

## 2022-06-05 DIAGNOSIS — R531 Weakness: Secondary | ICD-10-CM | POA: Diagnosis not present

## 2022-06-05 DIAGNOSIS — R310 Gross hematuria: Secondary | ICD-10-CM | POA: Diagnosis not present

## 2022-06-05 DIAGNOSIS — S32000D Wedge compression fracture of unspecified lumbar vertebra, subsequent encounter for fracture with routine healing: Secondary | ICD-10-CM | POA: Diagnosis not present

## 2022-06-05 DIAGNOSIS — R5381 Other malaise: Secondary | ICD-10-CM | POA: Diagnosis not present

## 2022-06-05 DIAGNOSIS — N179 Acute kidney failure, unspecified: Secondary | ICD-10-CM | POA: Diagnosis not present

## 2022-06-05 DIAGNOSIS — R1312 Dysphagia, oropharyngeal phase: Secondary | ICD-10-CM | POA: Diagnosis not present

## 2022-06-05 DIAGNOSIS — Z8639 Personal history of other endocrine, nutritional and metabolic disease: Secondary | ICD-10-CM | POA: Diagnosis not present

## 2022-06-05 DIAGNOSIS — N139 Obstructive and reflux uropathy, unspecified: Secondary | ICD-10-CM | POA: Diagnosis not present

## 2022-06-05 DIAGNOSIS — G9340 Encephalopathy, unspecified: Secondary | ICD-10-CM | POA: Diagnosis not present

## 2022-06-05 DIAGNOSIS — K449 Diaphragmatic hernia without obstruction or gangrene: Secondary | ICD-10-CM | POA: Diagnosis not present

## 2022-06-05 DIAGNOSIS — G9349 Other encephalopathy: Secondary | ICD-10-CM | POA: Diagnosis not present

## 2022-06-05 DIAGNOSIS — R339 Retention of urine, unspecified: Secondary | ICD-10-CM | POA: Diagnosis not present

## 2022-06-05 DIAGNOSIS — I10 Essential (primary) hypertension: Secondary | ICD-10-CM | POA: Diagnosis not present

## 2022-06-05 DIAGNOSIS — S22000A Wedge compression fracture of unspecified thoracic vertebra, initial encounter for closed fracture: Secondary | ICD-10-CM | POA: Diagnosis not present

## 2022-06-05 DIAGNOSIS — D72829 Elevated white blood cell count, unspecified: Secondary | ICD-10-CM | POA: Diagnosis not present

## 2022-06-05 DIAGNOSIS — N390 Urinary tract infection, site not specified: Secondary | ICD-10-CM | POA: Diagnosis not present

## 2022-06-05 DIAGNOSIS — K5901 Slow transit constipation: Secondary | ICD-10-CM | POA: Diagnosis not present

## 2022-06-05 DIAGNOSIS — R404 Transient alteration of awareness: Secondary | ICD-10-CM | POA: Diagnosis not present

## 2022-06-05 DIAGNOSIS — R0989 Other specified symptoms and signs involving the circulatory and respiratory systems: Secondary | ICD-10-CM | POA: Diagnosis not present

## 2022-06-05 DIAGNOSIS — J398 Other specified diseases of upper respiratory tract: Secondary | ICD-10-CM | POA: Diagnosis not present

## 2022-06-05 DIAGNOSIS — R600 Localized edema: Secondary | ICD-10-CM | POA: Diagnosis not present

## 2022-06-05 DIAGNOSIS — R2681 Unsteadiness on feet: Secondary | ICD-10-CM | POA: Diagnosis not present

## 2022-06-05 DIAGNOSIS — Z87448 Personal history of other diseases of urinary system: Secondary | ICD-10-CM | POA: Diagnosis not present

## 2022-06-05 LAB — BASIC METABOLIC PANEL
Anion gap: 8 (ref 5–15)
BUN: 24 mg/dL — ABNORMAL HIGH (ref 8–23)
CO2: 21 mmol/L — ABNORMAL LOW (ref 22–32)
Calcium: 8.3 mg/dL — ABNORMAL LOW (ref 8.9–10.3)
Chloride: 102 mmol/L (ref 98–111)
Creatinine, Ser: 1.04 mg/dL (ref 0.61–1.24)
GFR, Estimated: >60 mL/min (ref >60)
Glucose, Bld: 112 mg/dL — ABNORMAL HIGH (ref 70–99)
Potassium: 4.3 mmol/L (ref 3.5–5.1)
Sodium: 131 mmol/L — ABNORMAL LOW (ref 135–145)

## 2022-06-05 LAB — CBC
HCT: 31.4 % — ABNORMAL LOW (ref 39.0–52.0)
Hemoglobin: 10.5 g/dL — ABNORMAL LOW (ref 13.0–17.0)
MCH: 30.6 pg (ref 26.0–34.0)
MCHC: 33.4 g/dL (ref 30.0–36.0)
MCV: 91.5 fL (ref 80.0–100.0)
Platelets: 189 10*3/uL (ref 150–400)
RBC: 3.43 MIL/uL — ABNORMAL LOW (ref 4.22–5.81)
RDW: 16.1 % — ABNORMAL HIGH (ref 11.5–15.5)
WBC: 16.8 10*3/uL — ABNORMAL HIGH (ref 4.0–10.5)
nRBC: 0 % (ref 0.0–0.2)

## 2022-06-05 MED ORDER — SENNOSIDES-DOCUSATE SODIUM 8.6-50 MG PO TABS: 1.0000 | ORAL_TABLET | Freq: Two times a day (BID) | ORAL | Status: AC

## 2022-06-05 MED ORDER — CEFDINIR 300 MG PO CAPS: 300.0000 mg | ORAL_CAPSULE | Freq: Two times a day (BID) | ORAL | 0 refills | Status: AC

## 2022-06-05 MED ORDER — CEFDINIR 300 MG PO CAPS: 300.0000 mg | ORAL_CAPSULE | Freq: Two times a day (BID) | ORAL | 0 refills | Status: DC

## 2022-06-05 MED ORDER — POLYETHYLENE GLYCOL 3350 17 G PO PACK: 17.0000 g | PACK | Freq: Every day | ORAL | 0 refills | Status: AC | PRN

## 2022-06-05 NOTE — Discharge Summary (Signed)
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  AST 18  ALT 17  ALKPHOS 104  BILITOT 0.8  PROT 6.6  ALBUMIN 2.8*   No results for input(s): "LIPASE", "AMYLASE" in the last 168 hours. No results for input(s): "AMMONIA" in the last 168 hours. CBC: Recent Labs  Lab 06/01/22 0812 06/02/22 0336 06/03/22 0854 06/04/22 0759 06/05/22 0617  WBC 15.4* 16.1* 17.8* 19.1* 16.8*  HGB 10.6* 10.4* 10.6* 11.9* 10.5*  HCT 30.9* 30.4* 31.4* 37.1* 31.4*  MCV 89.6 89.4 90.2 93.2 91.5  PLT 143* 146* 166 201 189   Cardiac Enzymes: No results for input(s): "CKTOTAL", "CKMB", "CKMBINDEX", "TROPONINI" in the last 168 hours. BNP: Invalid input(s): "POCBNP" CBG: No results for input(s): "GLUCAP" in the last 168 hours. D-Dimer No results for input(s): "DDIMER" in the last 72 hours. Hgb A1c No results for input(s): "HGBA1C" in the last 72 hours. Lipid Profile No results for input(s):  "CHOL", "HDL", "LDLCALC", "TRIG", "CHOLHDL", "LDLDIRECT" in the last 72 hours. Thyroid function studies No results for input(s): "TSH", "T4TOTAL", "T3FREE", "THYROIDAB" in the last 72 hours.  Invalid input(s): "FREET3" Anemia work up No results for input(s): "VITAMINB12", "FOLATE", "FERRITIN", "TIBC", "IRON", "RETICCTPCT" in the last 72 hours. Urinalysis    Component Value Date/Time   COLORURINE YELLOW 05/30/2022 1559   APPEARANCEUR CLOUDY (A) 05/30/2022 1559   LABSPEC 1.014 05/30/2022 1559   PHURINE 5.0 05/30/2022 1559   GLUCOSEU NEGATIVE 05/30/2022 1559   HGBUR  LARGE (A) 05/30/2022 1559   BILIRUBINUR NEGATIVE 05/30/2022 1559   KETONESUR NEGATIVE 05/30/2022 1559   PROTEINUR 30 (A) 05/30/2022 1559   UROBILINOGEN 1.0 10/27/2012 1111   NITRITE NEGATIVE 05/30/2022 1559   LEUKOCYTESUR LARGE (A) 05/30/2022 1559   Sepsis Labs Recent Labs  Lab 06/02/22 0336 06/03/22 0854 06/04/22 0759 06/05/22 0617  WBC 16.1* 17.8* 19.1* 16.8*   Microbiology Recent Results (from the past 240 hour(s))  Urine Culture     Status: Abnormal   Collection Time: 05/30/22  1:46 PM   Specimen: In/Out Cath Urine  Result Value Ref Range Status   Specimen Description IN/OUT CATH URINE  Final   Special Requests   Final    NONE Performed at [facility], [place] [address] [place], [place] [address]    Culture MULTIPLE SPECIES PRESENT, SUGGEST RECOLLECTION (A)  Final   Report Status 05/31/2022 FINAL  Final  Blood Culture (routine x 2)     Status: None   Collection Time: 05/30/22  1:53 PM   Specimen: BLOOD LEFT FOREARM  Result Value Ref Range Status   Specimen Description BLOOD LEFT FOREARM  Final   Special Requests   Final    BOTTLES DRAWN AEROBIC AND ANAEROBIC Blood Culture results may not be optimal due to an inadequate volume of blood received in culture bottles   Culture   Final    NO GROWTH 5 DAYS Performed at [facility], [place] [address], [place], [place] [address]    Report Status  06/04/2022 FINAL  Final  Blood Culture (routine x 2)     Status: None   Collection Time: 05/30/22  2:20 PM   Specimen: BLOOD  Result Value Ref Range Status   Specimen Description BLOOD SITE NOT SPECIFIED  Final   Special Requests   Final    BOTTLES DRAWN AEROBIC AND ANAEROBIC Blood Culture results may not be optimal due to an excessive volume of blood received in culture bottles   Culture   Final    NO GROWTH 5 DAYS Performed at [facility], [place] [address], [place], [place] [address]    Report Status 06/04/2022 FINAL  Final     Time coordinating discharge: Over 30 minutes  SIGNED:   Porchea Charrier J [place] DO  [facility] 06/05/2022, 10:30 AM

## 2022-06-05 NOTE — Progress Notes (Signed)
Occupational Therapy Treatment Patient Details Name: Kevin Carr MRN: [id number] DOB: 1926/08/11 Today's Date: 06/05/2022   History of present illness Kevin Carr is a 88 y.o. male who presented to the ED 05/30/22  for evaluation of generalized weakness and functional decline. Son states that 1 month ago patient has been living independently, able to drive, ambulate, and care for self. Over the last 3 weeks he has had significant functional decline with generalized weakness, lethargy, occasional confusion.  He has not been able to ambulate on his own for the last week. New compression fractures of T6 and T8; acute renal failure; UTI; encephalopathy  PMH significant for remote prostate cancer, hypothyroidism, GERD, hiatal hernia with inverted stomach   OT comments  Patient received in supine and agreeable to OT treatment. Patient was max assist to get to EOB and performed grooming with setup and occasional assist for sitting balance. Patient agreed to attempt standing from EOB with face to face technique. Patient was max assist to stand x2 with increased time between standing. Patient was max assist to return to supine. Acute OT to continue to follow. Patient would benefit from further OT services in SNF setting.    Recommendations for follow up therapy are one component of a multi-disciplinary discharge planning process, led by the attending physician.  Recommendations may be updated based on patient status, additional functional criteria and insurance authorization.    Follow Up Recommendations  Skilled nursing-short term rehab (<3 hours/day)    Assistance Recommended at Discharge Frequent or constant Supervision/Assistance  Patient can return home with the following  A lot of help with walking and/or transfers;A lot of help with bathing/dressing/bathroom;Assistance with cooking/housework;Help with stairs or ramp for entrance;Assist for transportation   Equipment Recommendations  None  recommended by OT    Recommendations for Other Services      Precautions / Restrictions Precautions Precautions: Fall Precaution Comments: catheter Restrictions Weight Bearing Restrictions: No       Mobility Bed Mobility Overal bed mobility: Needs Assistance Bed Mobility: Rolling, Sidelying to Sit, Sit to Sidelying Rolling: Mod assist Sidelying to sit: Max assist     Sit to sidelying: Max assist General bed mobility comments: assistance with BLEs and trunk    Transfers Overall transfer level: Needs assistance Equipment used: None Transfers: Sit to/from Stand Sit to Stand: Max assist, From elevated surface           General transfer comment: face to face technique performed x2 with max assist     Balance Overall balance assessment: Needs assistance Sitting-balance support: Single extremity supported Sitting balance-Leahy Scale: Poor Sitting balance - Comments: performed grooming tasks seated on EOB with right lateral leaning Postural control: Right lateral lean Standing balance support: During functional activity, Reliant on assistive device for balance Standing balance-Leahy Scale: Poor Standing balance comment: reliant on therapist for balance                           ADL either performed or assessed with clinical judgement   ADL Overall ADL's : Needs assistance/impaired     Grooming: Wash/dry hands;Wash/dry face;Oral care;Set up;Sitting Grooming Details (indicate cue type and reason): occasional assist with sitting balance         Upper Body Dressing : Minimal assistance;Sitting Upper Body Dressing Details (indicate cue type and reason): changed gowns                   General ADL Comments: right  lateral leaning while sitting on EOB requiring occasional assist with balance    Extremity/Trunk Assessment              Vision       Perception     Praxis      Cognition Arousal/Alertness: Awake/alert Behavior During  Therapy: Flat affect Overall Cognitive Status: Difficult to assess                                 General Comments: alert and aware of possible discharge to SNF        Exercises      Shoulder Instructions       General Comments      Pertinent Vitals/ Pain       Pain Assessment Pain Assessment: Faces Faces Pain Scale: Hurts little more Pain Location: generalized Pain Descriptors / Indicators: Grimacing, Guarding Pain Intervention(s): Limited activity within patient's tolerance, Monitored during session, Repositioned  Home Living                                          Prior Functioning/Environment              Frequency  Min 2X/week        Progress Toward Goals  OT Goals(current goals can now be found in the care plan section)  Progress towards OT goals: Progressing toward goals  Acute Rehab OT Goals Patient Stated Goal: go to rehab OT Goal Formulation: With patient Time For Goal Achievement: 06/14/22 Potential to Achieve Goals: Good ADL Goals Pt Will Perform Lower Body Dressing: sit to/from stand;with min assist Pt Will Transfer to Toilet: with min guard assist;ambulating;grab bars Pt Will Perform Toileting - Clothing Manipulation and hygiene: with min guard assist;sit to/from stand Additional ADL Goal #1: Patient will perform 10 min functional activity without LOB or exercise activity as evidence of improving activity tolerance  Plan Discharge plan remains appropriate    Co-evaluation                 AM-PAC OT "6 Clicks" Daily Activity     Outcome Measure   Help from another person eating meals?: A Little Help from another person taking care of personal grooming?: A Little Help from another person toileting, which includes using toliet, bedpan, or urinal?: Total Help from another person bathing (including washing, rinsing, drying)?: A Lot Help from another person to put on and taking off regular upper body  clothing?: A Lot Help from another person to put on and taking off regular lower body clothing?: Total 6 Click Score: 12    End of Session Equipment Utilized During Treatment: Gait belt;Rolling walker (2 wheels)  OT Visit Diagnosis: Muscle weakness (generalized) (M62.81)   Activity Tolerance Patient tolerated treatment well   Patient Left in bed;with call bell/phone within reach;with bed alarm set   Nurse Communication Mobility status        Time: 1497-0263 OT Time Calculation (min): 38 min  Charges: OT General Charges $OT Visit: 1 Visit OT Treatments $Self Care/Home Management : 23-37 mins $Therapeutic Activity: 8-22 mins  Lodema Hong, OTA Acute Rehabilitation Services  Office [phone]   Trixie Dredge 06/05/2022, 12:13 PM

## 2022-06-05 NOTE — TOC Transition Note (Signed)
Transition of Care [facility]) - CM/SW Discharge Note   Patient Details  Name: Kevin Carr MRN: [id number] Date of Birth: 02/27/1926  [facility] CM/SW Contact:  Milinda Antis, [place] Phone Number: 06/05/2022, 12:09 PM   Clinical Narrative:    Patient will DC to:  [facility] Anticipated DC date: 06/05/2022 Family notified:Yes Transport by: Corey Harold   Per MD patient ready for DC to SNF. RN to call report prior to discharge [phone] room 703p. RN, patient, patient's family, and facility notified of DC. Discharge Summary and FL2 sent to facility. DC packet on chart. Ambulance transport will be requested for patient.   CSW will sign off for now as social work intervention is no longer needed. Please consult [place] again if new needs arise.     Final next level of care: Skilled Nursing Facility Barriers to Discharge: Barriers Resolved   Patient Goals and CMS Choice Patient states their goals for this hospitalization and ongoing recovery are:: To go to rehab CMS Medicare.gov Compare Post Acute Care list provided to:: Patient Choice offered to / list presented to : Patient  Discharge Placement              Patient chooses bed at: [facility] Patient to be transferred to facility by: [place] Name of family member notified: Jonta, [place] (Son)   [phone] Patient and family notified of of transfer: 06/05/22  Discharge Plan and Services                                     Social Determinants of Health (SDOH) Interventions     Readmission Risk Interventions     No data to display

## 2022-06-05 NOTE — Progress Notes (Signed)
DISCHARGE NOTE SNF [facility] to be discharged Skilled nursing facility per MD order. Patient verbalized understanding.  Skin clean, dry and intact without evidence of skin break down, no evidence of skin tears noted. IV catheter discontinued intact. Site without signs and symptoms of complications. Dressing and pressure applied. Pt denies pain at the site currently. No complaints noted.  Patient free of lines, drains, and wounds.   Discharge packet assembled. An After Visit Summary (AVS) was printed and given to the EMS personnel. Patient escorted via stretcher and discharged to [facility] via ambulance. Report called to accepting facility; all questions and concerns addressed.   Vira Agar, RN

## 2022-06-07 ENCOUNTER — Other Ambulatory Visit: Payer: Self-pay | Admitting: *Deleted

## 2022-06-07 DIAGNOSIS — R310 Gross hematuria: Secondary | ICD-10-CM | POA: Diagnosis not present

## 2022-06-07 DIAGNOSIS — R0989 Other specified symptoms and signs involving the circulatory and respiratory systems: Secondary | ICD-10-CM | POA: Diagnosis not present

## 2022-06-07 DIAGNOSIS — S22000D Wedge compression fracture of unspecified thoracic vertebra, subsequent encounter for fracture with routine healing: Secondary | ICD-10-CM | POA: Diagnosis not present

## 2022-06-07 DIAGNOSIS — N19 Unspecified kidney failure: Secondary | ICD-10-CM | POA: Diagnosis not present

## 2022-06-07 DIAGNOSIS — R8271 Bacteriuria: Secondary | ICD-10-CM | POA: Diagnosis not present

## 2022-06-07 DIAGNOSIS — J398 Other specified diseases of upper respiratory tract: Secondary | ICD-10-CM | POA: Diagnosis not present

## 2022-06-07 DIAGNOSIS — N179 Acute kidney failure, unspecified: Secondary | ICD-10-CM | POA: Diagnosis not present

## 2022-06-07 DIAGNOSIS — E871 Hypo-osmolality and hyponatremia: Secondary | ICD-10-CM | POA: Diagnosis not present

## 2022-06-07 DIAGNOSIS — D72829 Elevated white blood cell count, unspecified: Secondary | ICD-10-CM | POA: Diagnosis not present

## 2022-06-07 DIAGNOSIS — K449 Diaphragmatic hernia without obstruction or gangrene: Secondary | ICD-10-CM | POA: Diagnosis not present

## 2022-06-07 DIAGNOSIS — K562 Volvulus: Secondary | ICD-10-CM | POA: Diagnosis not present

## 2022-06-07 DIAGNOSIS — N1330 Unspecified hydronephrosis: Secondary | ICD-10-CM | POA: Diagnosis not present

## 2022-06-07 DIAGNOSIS — Z960 Presence of urogenital implants: Secondary | ICD-10-CM | POA: Diagnosis not present

## 2022-06-07 DIAGNOSIS — N139 Obstructive and reflux uropathy, unspecified: Secondary | ICD-10-CM | POA: Diagnosis not present

## 2022-06-07 DIAGNOSIS — S32000D Wedge compression fracture of unspecified lumbar vertebra, subsequent encounter for fracture with routine healing: Secondary | ICD-10-CM | POA: Diagnosis not present

## 2022-06-07 DIAGNOSIS — K5901 Slow transit constipation: Secondary | ICD-10-CM | POA: Diagnosis not present

## 2022-06-07 DIAGNOSIS — G9349 Other encephalopathy: Secondary | ICD-10-CM | POA: Diagnosis not present

## 2022-06-07 DIAGNOSIS — E039 Hypothyroidism, unspecified: Secondary | ICD-10-CM | POA: Diagnosis not present

## 2022-06-07 NOTE — Patient Outreach (Signed)
Mr. Martin resides in [facility] SNF. Screening for potential care coordination/care management services.   Facility site visit to [facility] and [facility]. Met with Irine Seal and Kirstin, SNF social workers. Mr. Pelzer admitted to SNF on 06/05/22. Mr. Kuyper is from home with his son.  Will continue to follow for transition plans.    Marthenia Rolling, MSN, RN,BSN [place] Acute Care Coordinator [phone] [facility]) [phone]  (Toll free office)

## 2022-06-11 DIAGNOSIS — N1330 Unspecified hydronephrosis: Secondary | ICD-10-CM | POA: Diagnosis not present

## 2022-06-11 DIAGNOSIS — G9349 Other encephalopathy: Secondary | ICD-10-CM | POA: Diagnosis not present

## 2022-06-11 DIAGNOSIS — N139 Obstructive and reflux uropathy, unspecified: Secondary | ICD-10-CM | POA: Diagnosis not present

## 2022-06-11 DIAGNOSIS — Z960 Presence of urogenital implants: Secondary | ICD-10-CM | POA: Diagnosis not present

## 2022-06-11 DIAGNOSIS — E871 Hypo-osmolality and hyponatremia: Secondary | ICD-10-CM | POA: Diagnosis not present

## 2022-06-11 DIAGNOSIS — G479 Sleep disorder, unspecified: Secondary | ICD-10-CM | POA: Diagnosis not present

## 2022-06-12 DIAGNOSIS — I10 Essential (primary) hypertension: Secondary | ICD-10-CM | POA: Diagnosis not present

## 2022-06-14 ENCOUNTER — Other Ambulatory Visit: Payer: Self-pay | Admitting: *Deleted

## 2022-06-14 NOTE — Patient Outreach (Addendum)
[facility] Post- Acute Care Coordinator follow up. Mr. Carreira resides in [facility] and Rehab SNF. Screening for care coordination/care management services as a benefit of insurance plan and PCP.  Facility site visit to [facility]. Met with Amaryllis Dyke, SNF social workers, and Audiological scientist. Mr. Nhan will transition home next week. Family will move in with Mr. Roye to ensure 24/7 supervision. Will have [facility].  Will continue to follow.    Marthenia Rolling, MSN, RN,BSN [place] Acute Care Coordinator [phone] [facility]) [phone]  (Toll free office)

## 2022-06-15 DIAGNOSIS — N179 Acute kidney failure, unspecified: Secondary | ICD-10-CM | POA: Diagnosis not present

## 2022-06-15 DIAGNOSIS — N139 Obstructive and reflux uropathy, unspecified: Secondary | ICD-10-CM | POA: Diagnosis not present

## 2022-06-15 DIAGNOSIS — N390 Urinary tract infection, site not specified: Secondary | ICD-10-CM | POA: Diagnosis not present

## 2022-06-15 DIAGNOSIS — S22000A Wedge compression fracture of unspecified thoracic vertebra, initial encounter for closed fracture: Secondary | ICD-10-CM | POA: Diagnosis not present

## 2022-06-15 DIAGNOSIS — R531 Weakness: Secondary | ICD-10-CM | POA: Diagnosis not present

## 2022-06-15 DIAGNOSIS — S22000D Wedge compression fracture of unspecified thoracic vertebra, subsequent encounter for fracture with routine healing: Secondary | ICD-10-CM | POA: Diagnosis not present

## 2022-06-15 DIAGNOSIS — R5381 Other malaise: Secondary | ICD-10-CM | POA: Diagnosis not present

## 2022-06-15 DIAGNOSIS — R2681 Unsteadiness on feet: Secondary | ICD-10-CM | POA: Diagnosis not present

## 2022-06-15 DIAGNOSIS — M6281 Muscle weakness (generalized): Secondary | ICD-10-CM | POA: Diagnosis not present

## 2022-06-15 DIAGNOSIS — G9340 Encephalopathy, unspecified: Secondary | ICD-10-CM | POA: Diagnosis not present

## 2022-06-15 DIAGNOSIS — G9349 Other encephalopathy: Secondary | ICD-10-CM | POA: Diagnosis not present

## 2022-06-20 DIAGNOSIS — N179 Acute kidney failure, unspecified: Secondary | ICD-10-CM | POA: Diagnosis not present

## 2022-06-20 DIAGNOSIS — N130 Hydronephrosis with ureteropelvic junction obstruction: Secondary | ICD-10-CM | POA: Diagnosis not present

## 2022-06-20 DIAGNOSIS — N390 Urinary tract infection, site not specified: Secondary | ICD-10-CM | POA: Diagnosis not present

## 2022-06-20 DIAGNOSIS — R2681 Unsteadiness on feet: Secondary | ICD-10-CM | POA: Diagnosis not present

## 2022-06-20 DIAGNOSIS — R339 Retention of urine, unspecified: Secondary | ICD-10-CM | POA: Diagnosis not present

## 2022-06-20 DIAGNOSIS — Z87448 Personal history of other diseases of urinary system: Secondary | ICD-10-CM | POA: Diagnosis not present

## 2022-06-20 DIAGNOSIS — Z8639 Personal history of other endocrine, nutritional and metabolic disease: Secondary | ICD-10-CM | POA: Diagnosis not present

## 2022-06-20 DIAGNOSIS — R5381 Other malaise: Secondary | ICD-10-CM | POA: Diagnosis not present

## 2022-06-20 DIAGNOSIS — S22000A Wedge compression fracture of unspecified thoracic vertebra, initial encounter for closed fracture: Secondary | ICD-10-CM | POA: Diagnosis not present

## 2022-06-20 DIAGNOSIS — G9340 Encephalopathy, unspecified: Secondary | ICD-10-CM | POA: Diagnosis not present

## 2022-06-20 DIAGNOSIS — R600 Localized edema: Secondary | ICD-10-CM | POA: Diagnosis not present

## 2022-06-20 DIAGNOSIS — D72829 Elevated white blood cell count, unspecified: Secondary | ICD-10-CM | POA: Diagnosis not present

## 2022-06-20 DIAGNOSIS — N201 Calculus of ureter: Secondary | ICD-10-CM | POA: Diagnosis not present

## 2022-06-20 DIAGNOSIS — S22000D Wedge compression fracture of unspecified thoracic vertebra, subsequent encounter for fracture with routine healing: Secondary | ICD-10-CM | POA: Diagnosis not present

## 2022-06-20 DIAGNOSIS — Z09 Encounter for follow-up examination after completed treatment for conditions other than malignant neoplasm: Secondary | ICD-10-CM | POA: Diagnosis not present

## 2022-06-20 DIAGNOSIS — N1330 Unspecified hydronephrosis: Secondary | ICD-10-CM | POA: Diagnosis not present

## 2022-06-20 DIAGNOSIS — N139 Obstructive and reflux uropathy, unspecified: Secondary | ICD-10-CM | POA: Diagnosis not present

## 2022-06-20 DIAGNOSIS — M6281 Muscle weakness (generalized): Secondary | ICD-10-CM | POA: Diagnosis not present

## 2022-06-20 DIAGNOSIS — R531 Weakness: Secondary | ICD-10-CM | POA: Diagnosis not present

## 2022-06-20 DIAGNOSIS — G9349 Other encephalopathy: Secondary | ICD-10-CM | POA: Diagnosis not present

## 2022-06-21 ENCOUNTER — Other Ambulatory Visit: Payer: Self-pay | Admitting: *Deleted

## 2022-06-21 NOTE — Patient Outreach (Signed)
[place] Coordinator follow up. Kevin Carr resides in [facility].   Facility site visit to [facility]. Met with Amaryllis Dyke, SNF social workers, and Verdis Frederickson, Civil engineer, contracting. Kevin Carr continues to participate and progress with therapy. Plans to return home post SNF.   Went to bedside to speak with Kevin Carr. However, he was occupied. Will follow up at later time.   Will continue to follow while Kevin Carr resides in SNF.   Marthenia Rolling, MSN, RN,BSN [place] Acute Care Coordinator [phone] [facility]) [phone]  (Toll free office)

## 2022-06-22 DIAGNOSIS — N390 Urinary tract infection, site not specified: Secondary | ICD-10-CM | POA: Diagnosis not present

## 2022-06-22 DIAGNOSIS — M6281 Muscle weakness (generalized): Secondary | ICD-10-CM | POA: Diagnosis not present

## 2022-06-22 DIAGNOSIS — G9340 Encephalopathy, unspecified: Secondary | ICD-10-CM | POA: Diagnosis not present

## 2022-06-22 DIAGNOSIS — R531 Weakness: Secondary | ICD-10-CM | POA: Diagnosis not present

## 2022-06-22 DIAGNOSIS — S22000A Wedge compression fracture of unspecified thoracic vertebra, initial encounter for closed fracture: Secondary | ICD-10-CM | POA: Diagnosis not present

## 2022-06-22 DIAGNOSIS — S22000D Wedge compression fracture of unspecified thoracic vertebra, subsequent encounter for fracture with routine healing: Secondary | ICD-10-CM | POA: Diagnosis not present

## 2022-06-22 DIAGNOSIS — N139 Obstructive and reflux uropathy, unspecified: Secondary | ICD-10-CM | POA: Diagnosis not present

## 2022-06-22 DIAGNOSIS — G9349 Other encephalopathy: Secondary | ICD-10-CM | POA: Diagnosis not present

## 2022-06-22 DIAGNOSIS — R2681 Unsteadiness on feet: Secondary | ICD-10-CM | POA: Diagnosis not present

## 2022-06-22 DIAGNOSIS — N179 Acute kidney failure, unspecified: Secondary | ICD-10-CM | POA: Diagnosis not present

## 2022-06-22 DIAGNOSIS — R5381 Other malaise: Secondary | ICD-10-CM | POA: Diagnosis not present

## 2022-06-25 DIAGNOSIS — M6281 Muscle weakness (generalized): Secondary | ICD-10-CM | POA: Diagnosis not present

## 2022-06-25 DIAGNOSIS — S22000A Wedge compression fracture of unspecified thoracic vertebra, initial encounter for closed fracture: Secondary | ICD-10-CM | POA: Diagnosis not present

## 2022-06-25 DIAGNOSIS — N139 Obstructive and reflux uropathy, unspecified: Secondary | ICD-10-CM | POA: Diagnosis not present

## 2022-06-25 DIAGNOSIS — N179 Acute kidney failure, unspecified: Secondary | ICD-10-CM | POA: Diagnosis not present

## 2022-06-25 DIAGNOSIS — R531 Weakness: Secondary | ICD-10-CM | POA: Diagnosis not present

## 2022-06-25 DIAGNOSIS — R5381 Other malaise: Secondary | ICD-10-CM | POA: Diagnosis not present

## 2022-06-25 DIAGNOSIS — S22000D Wedge compression fracture of unspecified thoracic vertebra, subsequent encounter for fracture with routine healing: Secondary | ICD-10-CM | POA: Diagnosis not present

## 2022-06-25 DIAGNOSIS — G9340 Encephalopathy, unspecified: Secondary | ICD-10-CM | POA: Diagnosis not present

## 2022-06-25 DIAGNOSIS — R2681 Unsteadiness on feet: Secondary | ICD-10-CM | POA: Diagnosis not present

## 2022-06-25 DIAGNOSIS — G9349 Other encephalopathy: Secondary | ICD-10-CM | POA: Diagnosis not present

## 2022-06-25 DIAGNOSIS — N390 Urinary tract infection, site not specified: Secondary | ICD-10-CM | POA: Diagnosis not present

## 2022-06-26 DIAGNOSIS — L89156 Pressure-induced deep tissue damage of sacral region: Secondary | ICD-10-CM | POA: Diagnosis not present

## 2022-06-28 ENCOUNTER — Other Ambulatory Visit: Payer: Self-pay | Admitting: *Deleted

## 2022-06-28 DIAGNOSIS — S22000A Wedge compression fracture of unspecified thoracic vertebra, initial encounter for closed fracture: Secondary | ICD-10-CM | POA: Diagnosis not present

## 2022-06-28 DIAGNOSIS — N179 Acute kidney failure, unspecified: Secondary | ICD-10-CM | POA: Diagnosis not present

## 2022-06-28 DIAGNOSIS — R5381 Other malaise: Secondary | ICD-10-CM | POA: Diagnosis not present

## 2022-06-28 DIAGNOSIS — S22000D Wedge compression fracture of unspecified thoracic vertebra, subsequent encounter for fracture with routine healing: Secondary | ICD-10-CM | POA: Diagnosis not present

## 2022-06-28 DIAGNOSIS — M6281 Muscle weakness (generalized): Secondary | ICD-10-CM | POA: Diagnosis not present

## 2022-06-28 DIAGNOSIS — N139 Obstructive and reflux uropathy, unspecified: Secondary | ICD-10-CM | POA: Diagnosis not present

## 2022-06-28 DIAGNOSIS — G9340 Encephalopathy, unspecified: Secondary | ICD-10-CM | POA: Diagnosis not present

## 2022-06-28 DIAGNOSIS — G9349 Other encephalopathy: Secondary | ICD-10-CM | POA: Diagnosis not present

## 2022-06-28 DIAGNOSIS — R2681 Unsteadiness on feet: Secondary | ICD-10-CM | POA: Diagnosis not present

## 2022-06-28 DIAGNOSIS — L8945 Pressure ulcer of contiguous site of back, buttock and hip, unstageable: Secondary | ICD-10-CM | POA: Diagnosis not present

## 2022-06-28 DIAGNOSIS — N390 Urinary tract infection, site not specified: Secondary | ICD-10-CM | POA: Diagnosis not present

## 2022-06-28 DIAGNOSIS — R531 Weakness: Secondary | ICD-10-CM | POA: Diagnosis not present

## 2022-06-28 NOTE — Patient Outreach (Signed)
[place] Coordinator follow up.  Mr. Kevin Carr resides in [facility].   Met with Kevin Carr and Kevin Carr SNF social workers at [employer] [facility] [employer]. Plan is to return home. Discharge recently appealed.   Mr. Kevin Carr PCP has [facility] care management.   Kevin Rolling, MSN, RN,BSN [place] Acute Care Coordinator [phone] [facility]) [phone]  (Toll free office)

## 2022-06-29 DIAGNOSIS — R531 Weakness: Secondary | ICD-10-CM | POA: Diagnosis not present

## 2022-06-29 DIAGNOSIS — N179 Acute kidney failure, unspecified: Secondary | ICD-10-CM | POA: Diagnosis not present

## 2022-06-29 DIAGNOSIS — N139 Obstructive and reflux uropathy, unspecified: Secondary | ICD-10-CM | POA: Diagnosis not present

## 2022-06-29 DIAGNOSIS — S22000A Wedge compression fracture of unspecified thoracic vertebra, initial encounter for closed fracture: Secondary | ICD-10-CM | POA: Diagnosis not present

## 2022-06-29 DIAGNOSIS — L8945 Pressure ulcer of contiguous site of back, buttock and hip, unstageable: Secondary | ICD-10-CM | POA: Diagnosis not present

## 2022-06-29 DIAGNOSIS — R5381 Other malaise: Secondary | ICD-10-CM | POA: Diagnosis not present

## 2022-06-29 DIAGNOSIS — G9340 Encephalopathy, unspecified: Secondary | ICD-10-CM | POA: Diagnosis not present

## 2022-06-29 DIAGNOSIS — S22000D Wedge compression fracture of unspecified thoracic vertebra, subsequent encounter for fracture with routine healing: Secondary | ICD-10-CM | POA: Diagnosis not present

## 2022-06-29 DIAGNOSIS — M6281 Muscle weakness (generalized): Secondary | ICD-10-CM | POA: Diagnosis not present

## 2022-06-29 DIAGNOSIS — R1312 Dysphagia, oropharyngeal phase: Secondary | ICD-10-CM | POA: Diagnosis not present

## 2022-06-29 DIAGNOSIS — G9349 Other encephalopathy: Secondary | ICD-10-CM | POA: Diagnosis not present

## 2022-06-29 DIAGNOSIS — R2681 Unsteadiness on feet: Secondary | ICD-10-CM | POA: Diagnosis not present

## 2022-06-29 DIAGNOSIS — N390 Urinary tract infection, site not specified: Secondary | ICD-10-CM | POA: Diagnosis not present

## 2022-06-30 DIAGNOSIS — M6281 Muscle weakness (generalized): Secondary | ICD-10-CM | POA: Diagnosis not present

## 2022-06-30 DIAGNOSIS — R2681 Unsteadiness on feet: Secondary | ICD-10-CM | POA: Diagnosis not present

## 2022-06-30 DIAGNOSIS — R1312 Dysphagia, oropharyngeal phase: Secondary | ICD-10-CM | POA: Diagnosis not present

## 2022-06-30 DIAGNOSIS — N179 Acute kidney failure, unspecified: Secondary | ICD-10-CM | POA: Diagnosis not present

## 2022-07-01 DIAGNOSIS — N179 Acute kidney failure, unspecified: Secondary | ICD-10-CM | POA: Diagnosis not present

## 2022-07-01 DIAGNOSIS — R1312 Dysphagia, oropharyngeal phase: Secondary | ICD-10-CM | POA: Diagnosis not present

## 2022-07-01 DIAGNOSIS — R2681 Unsteadiness on feet: Secondary | ICD-10-CM | POA: Diagnosis not present

## 2022-07-01 DIAGNOSIS — M6281 Muscle weakness (generalized): Secondary | ICD-10-CM | POA: Diagnosis not present

## 2022-07-02 DIAGNOSIS — R1312 Dysphagia, oropharyngeal phase: Secondary | ICD-10-CM | POA: Diagnosis not present

## 2022-07-02 DIAGNOSIS — M6281 Muscle weakness (generalized): Secondary | ICD-10-CM | POA: Diagnosis not present

## 2022-07-02 DIAGNOSIS — N179 Acute kidney failure, unspecified: Secondary | ICD-10-CM | POA: Diagnosis not present

## 2022-07-02 DIAGNOSIS — R2681 Unsteadiness on feet: Secondary | ICD-10-CM | POA: Diagnosis not present

## 2022-07-03 DIAGNOSIS — N390 Urinary tract infection, site not specified: Secondary | ICD-10-CM | POA: Diagnosis not present

## 2022-07-03 DIAGNOSIS — L8945 Pressure ulcer of contiguous site of back, buttock and hip, unstageable: Secondary | ICD-10-CM | POA: Diagnosis not present

## 2022-07-03 DIAGNOSIS — G9349 Other encephalopathy: Secondary | ICD-10-CM | POA: Diagnosis not present

## 2022-07-03 DIAGNOSIS — R5381 Other malaise: Secondary | ICD-10-CM | POA: Diagnosis not present

## 2022-07-03 DIAGNOSIS — L89156 Pressure-induced deep tissue damage of sacral region: Secondary | ICD-10-CM | POA: Diagnosis not present

## 2022-07-03 DIAGNOSIS — N179 Acute kidney failure, unspecified: Secondary | ICD-10-CM | POA: Diagnosis not present

## 2022-07-03 DIAGNOSIS — M6281 Muscle weakness (generalized): Secondary | ICD-10-CM | POA: Diagnosis not present

## 2022-07-03 DIAGNOSIS — R2681 Unsteadiness on feet: Secondary | ICD-10-CM | POA: Diagnosis not present

## 2022-07-03 DIAGNOSIS — N139 Obstructive and reflux uropathy, unspecified: Secondary | ICD-10-CM | POA: Diagnosis not present

## 2022-07-03 DIAGNOSIS — G9340 Encephalopathy, unspecified: Secondary | ICD-10-CM | POA: Diagnosis not present

## 2022-07-03 DIAGNOSIS — R1312 Dysphagia, oropharyngeal phase: Secondary | ICD-10-CM | POA: Diagnosis not present

## 2022-07-03 DIAGNOSIS — S22000D Wedge compression fracture of unspecified thoracic vertebra, subsequent encounter for fracture with routine healing: Secondary | ICD-10-CM | POA: Diagnosis not present

## 2022-07-03 DIAGNOSIS — S22000A Wedge compression fracture of unspecified thoracic vertebra, initial encounter for closed fracture: Secondary | ICD-10-CM | POA: Diagnosis not present

## 2022-07-03 DIAGNOSIS — R531 Weakness: Secondary | ICD-10-CM | POA: Diagnosis not present

## 2022-07-04 DIAGNOSIS — R609 Edema, unspecified: Secondary | ICD-10-CM | POA: Diagnosis not present

## 2022-07-04 DIAGNOSIS — E871 Hypo-osmolality and hyponatremia: Secondary | ICD-10-CM | POA: Diagnosis not present

## 2022-07-04 DIAGNOSIS — R059 Cough, unspecified: Secondary | ICD-10-CM | POA: Diagnosis not present

## 2022-07-04 DIAGNOSIS — S22000D Wedge compression fracture of unspecified thoracic vertebra, subsequent encounter for fracture with routine healing: Secondary | ICD-10-CM | POA: Diagnosis not present

## 2022-07-04 DIAGNOSIS — R319 Hematuria, unspecified: Secondary | ICD-10-CM | POA: Diagnosis not present

## 2022-07-04 DIAGNOSIS — Z9181 History of falling: Secondary | ICD-10-CM | POA: Diagnosis not present

## 2022-07-04 DIAGNOSIS — E8770 Fluid overload, unspecified: Secondary | ICD-10-CM | POA: Diagnosis not present

## 2022-07-04 DIAGNOSIS — R8290 Unspecified abnormal findings in urine: Secondary | ICD-10-CM | POA: Diagnosis not present

## 2022-07-04 DIAGNOSIS — N139 Obstructive and reflux uropathy, unspecified: Secondary | ICD-10-CM | POA: Diagnosis not present

## 2022-07-04 DIAGNOSIS — Z960 Presence of urogenital implants: Secondary | ICD-10-CM | POA: Diagnosis not present

## 2022-07-04 DIAGNOSIS — K449 Diaphragmatic hernia without obstruction or gangrene: Secondary | ICD-10-CM | POA: Diagnosis not present

## 2022-07-04 DIAGNOSIS — S32000D Wedge compression fracture of unspecified lumbar vertebra, subsequent encounter for fracture with routine healing: Secondary | ICD-10-CM | POA: Diagnosis not present

## 2022-07-05 ENCOUNTER — Other Ambulatory Visit: Payer: Self-pay | Admitting: *Deleted

## 2022-07-05 DIAGNOSIS — R059 Cough, unspecified: Secondary | ICD-10-CM | POA: Diagnosis not present

## 2022-07-05 DIAGNOSIS — R2681 Unsteadiness on feet: Secondary | ICD-10-CM | POA: Diagnosis not present

## 2022-07-05 DIAGNOSIS — M6281 Muscle weakness (generalized): Secondary | ICD-10-CM | POA: Diagnosis not present

## 2022-07-05 DIAGNOSIS — N390 Urinary tract infection, site not specified: Secondary | ICD-10-CM | POA: Diagnosis not present

## 2022-07-05 DIAGNOSIS — E871 Hypo-osmolality and hyponatremia: Secondary | ICD-10-CM | POA: Diagnosis not present

## 2022-07-05 DIAGNOSIS — R0989 Other specified symptoms and signs involving the circulatory and respiratory systems: Secondary | ICD-10-CM | POA: Diagnosis not present

## 2022-07-05 DIAGNOSIS — R310 Gross hematuria: Secondary | ICD-10-CM | POA: Diagnosis not present

## 2022-07-05 DIAGNOSIS — N179 Acute kidney failure, unspecified: Secondary | ICD-10-CM | POA: Diagnosis not present

## 2022-07-05 DIAGNOSIS — R1312 Dysphagia, oropharyngeal phase: Secondary | ICD-10-CM | POA: Diagnosis not present

## 2022-07-05 NOTE — Patient Outreach (Signed)
[place] Coordinator follow up. Mr. Burdin resides in [facility] SNF.   Facility site visit to [facility]. Met with therapy manager and SNF social workers. Mr. Grell lost second level appeal. Will privately pay to stay in [place] until home renovations/accommodations are completed. Anticipate another 2 weeks or so.   PCP with [facility] [place] has [facility] care management services. Will send notification upon SNF discharge.    Marthenia Rolling, MSN, RN,BSN [place] Acute Care Coordinator [phone] [facility]) [phone]  (Toll free office)

## 2022-07-06 DIAGNOSIS — Z960 Presence of urogenital implants: Secondary | ICD-10-CM | POA: Diagnosis not present

## 2022-07-06 DIAGNOSIS — E8770 Fluid overload, unspecified: Secondary | ICD-10-CM | POA: Diagnosis not present

## 2022-07-06 DIAGNOSIS — N390 Urinary tract infection, site not specified: Secondary | ICD-10-CM | POA: Diagnosis not present

## 2022-07-06 DIAGNOSIS — I509 Heart failure, unspecified: Secondary | ICD-10-CM | POA: Diagnosis not present

## 2022-07-06 DIAGNOSIS — E871 Hypo-osmolality and hyponatremia: Secondary | ICD-10-CM | POA: Diagnosis not present

## 2022-07-06 DIAGNOSIS — N1832 Chronic kidney disease, stage 3b: Secondary | ICD-10-CM | POA: Diagnosis not present

## 2022-07-08 DIAGNOSIS — R1312 Dysphagia, oropharyngeal phase: Secondary | ICD-10-CM | POA: Diagnosis not present

## 2022-07-08 DIAGNOSIS — M6281 Muscle weakness (generalized): Secondary | ICD-10-CM | POA: Diagnosis not present

## 2022-07-08 DIAGNOSIS — R2681 Unsteadiness on feet: Secondary | ICD-10-CM | POA: Diagnosis not present

## 2022-07-08 DIAGNOSIS — N179 Acute kidney failure, unspecified: Secondary | ICD-10-CM | POA: Diagnosis not present

## 2022-07-09 DIAGNOSIS — S22000D Wedge compression fracture of unspecified thoracic vertebra, subsequent encounter for fracture with routine healing: Secondary | ICD-10-CM | POA: Diagnosis not present

## 2022-07-09 DIAGNOSIS — N179 Acute kidney failure, unspecified: Secondary | ICD-10-CM | POA: Diagnosis not present

## 2022-07-09 DIAGNOSIS — M6281 Muscle weakness (generalized): Secondary | ICD-10-CM | POA: Diagnosis not present

## 2022-07-09 DIAGNOSIS — R0989 Other specified symptoms and signs involving the circulatory and respiratory systems: Secondary | ICD-10-CM | POA: Diagnosis not present

## 2022-07-09 DIAGNOSIS — Z9981 Dependence on supplemental oxygen: Secondary | ICD-10-CM | POA: Diagnosis not present

## 2022-07-09 DIAGNOSIS — R2681 Unsteadiness on feet: Secondary | ICD-10-CM | POA: Diagnosis not present

## 2022-07-09 DIAGNOSIS — L8945 Pressure ulcer of contiguous site of back, buttock and hip, unstageable: Secondary | ICD-10-CM | POA: Diagnosis not present

## 2022-07-09 DIAGNOSIS — G9349 Other encephalopathy: Secondary | ICD-10-CM | POA: Diagnosis not present

## 2022-07-09 DIAGNOSIS — R600 Localized edema: Secondary | ICD-10-CM | POA: Diagnosis not present

## 2022-07-09 DIAGNOSIS — D72829 Elevated white blood cell count, unspecified: Secondary | ICD-10-CM | POA: Diagnosis not present

## 2022-07-09 DIAGNOSIS — R531 Weakness: Secondary | ICD-10-CM | POA: Diagnosis not present

## 2022-07-09 DIAGNOSIS — N139 Obstructive and reflux uropathy, unspecified: Secondary | ICD-10-CM | POA: Diagnosis not present

## 2022-07-09 DIAGNOSIS — R310 Gross hematuria: Secondary | ICD-10-CM | POA: Diagnosis not present

## 2022-07-09 DIAGNOSIS — R059 Cough, unspecified: Secondary | ICD-10-CM | POA: Diagnosis not present

## 2022-07-09 DIAGNOSIS — S22000A Wedge compression fracture of unspecified thoracic vertebra, initial encounter for closed fracture: Secondary | ICD-10-CM | POA: Diagnosis not present

## 2022-07-09 DIAGNOSIS — G9340 Encephalopathy, unspecified: Secondary | ICD-10-CM | POA: Diagnosis not present

## 2022-07-09 DIAGNOSIS — N390 Urinary tract infection, site not specified: Secondary | ICD-10-CM | POA: Diagnosis not present

## 2022-07-09 DIAGNOSIS — R1312 Dysphagia, oropharyngeal phase: Secondary | ICD-10-CM | POA: Diagnosis not present

## 2022-07-09 DIAGNOSIS — R5381 Other malaise: Secondary | ICD-10-CM | POA: Diagnosis not present

## 2022-07-10 DIAGNOSIS — N179 Acute kidney failure, unspecified: Secondary | ICD-10-CM | POA: Diagnosis not present

## 2022-07-10 DIAGNOSIS — R601 Generalized edema: Secondary | ICD-10-CM | POA: Diagnosis not present

## 2022-07-10 DIAGNOSIS — R2681 Unsteadiness on feet: Secondary | ICD-10-CM | POA: Diagnosis not present

## 2022-07-10 DIAGNOSIS — M6281 Muscle weakness (generalized): Secondary | ICD-10-CM | POA: Diagnosis not present

## 2022-07-10 DIAGNOSIS — R1312 Dysphagia, oropharyngeal phase: Secondary | ICD-10-CM | POA: Diagnosis not present

## 2022-07-11 DIAGNOSIS — S22000D Wedge compression fracture of unspecified thoracic vertebra, subsequent encounter for fracture with routine healing: Secondary | ICD-10-CM | POA: Diagnosis not present

## 2022-07-11 DIAGNOSIS — N390 Urinary tract infection, site not specified: Secondary | ICD-10-CM | POA: Diagnosis not present

## 2022-07-11 DIAGNOSIS — R2681 Unsteadiness on feet: Secondary | ICD-10-CM | POA: Diagnosis not present

## 2022-07-11 DIAGNOSIS — G9340 Encephalopathy, unspecified: Secondary | ICD-10-CM | POA: Diagnosis not present

## 2022-07-11 DIAGNOSIS — L8945 Pressure ulcer of contiguous site of back, buttock and hip, unstageable: Secondary | ICD-10-CM | POA: Diagnosis not present

## 2022-07-11 DIAGNOSIS — G9349 Other encephalopathy: Secondary | ICD-10-CM | POA: Diagnosis not present

## 2022-07-11 DIAGNOSIS — R1312 Dysphagia, oropharyngeal phase: Secondary | ICD-10-CM | POA: Diagnosis not present

## 2022-07-11 DIAGNOSIS — R531 Weakness: Secondary | ICD-10-CM | POA: Diagnosis not present

## 2022-07-11 DIAGNOSIS — N139 Obstructive and reflux uropathy, unspecified: Secondary | ICD-10-CM | POA: Diagnosis not present

## 2022-07-11 DIAGNOSIS — N179 Acute kidney failure, unspecified: Secondary | ICD-10-CM | POA: Diagnosis not present

## 2022-07-11 DIAGNOSIS — S22000A Wedge compression fracture of unspecified thoracic vertebra, initial encounter for closed fracture: Secondary | ICD-10-CM | POA: Diagnosis not present

## 2022-07-11 DIAGNOSIS — R5381 Other malaise: Secondary | ICD-10-CM | POA: Diagnosis not present

## 2022-07-11 DIAGNOSIS — M6281 Muscle weakness (generalized): Secondary | ICD-10-CM | POA: Diagnosis not present

## 2022-07-12 DIAGNOSIS — M6281 Muscle weakness (generalized): Secondary | ICD-10-CM | POA: Diagnosis not present

## 2022-07-12 DIAGNOSIS — R2681 Unsteadiness on feet: Secondary | ICD-10-CM | POA: Diagnosis not present

## 2022-07-12 DIAGNOSIS — N179 Acute kidney failure, unspecified: Secondary | ICD-10-CM | POA: Diagnosis not present

## 2022-07-12 DIAGNOSIS — L900 Lichen sclerosus et atrophicus: Secondary | ICD-10-CM | POA: Diagnosis not present

## 2022-07-12 DIAGNOSIS — R1312 Dysphagia, oropharyngeal phase: Secondary | ICD-10-CM | POA: Diagnosis not present

## 2022-07-13 DIAGNOSIS — R52 Pain, unspecified: Secondary | ICD-10-CM | POA: Diagnosis not present

## 2022-07-13 DIAGNOSIS — R339 Retention of urine, unspecified: Secondary | ICD-10-CM | POA: Diagnosis not present

## 2022-07-13 DIAGNOSIS — R5381 Other malaise: Secondary | ICD-10-CM | POA: Diagnosis not present

## 2022-07-13 DIAGNOSIS — E8770 Fluid overload, unspecified: Secondary | ICD-10-CM | POA: Diagnosis not present

## 2022-07-14 ENCOUNTER — Inpatient Hospital Stay (HOSPITAL_COMMUNITY)
Admission: EM | Admit: 2022-07-14 | Discharge: 2022-07-29 | DRG: 177 | Disposition: E | Payer: Medicare Other | Source: Skilled Nursing Facility | Attending: Internal Medicine | Admitting: Internal Medicine

## 2022-07-14 ENCOUNTER — Other Ambulatory Visit: Payer: Self-pay

## 2022-07-14 ENCOUNTER — Emergency Department (HOSPITAL_COMMUNITY): Payer: Medicare Other

## 2022-07-14 DIAGNOSIS — Z923 Personal history of irradiation: Secondary | ICD-10-CM

## 2022-07-14 DIAGNOSIS — R8281 Pyuria: Secondary | ICD-10-CM | POA: Diagnosis not present

## 2022-07-14 DIAGNOSIS — K449 Diaphragmatic hernia without obstruction or gangrene: Secondary | ICD-10-CM | POA: Diagnosis present

## 2022-07-14 DIAGNOSIS — Z8546 Personal history of malignant neoplasm of prostate: Secondary | ICD-10-CM

## 2022-07-14 DIAGNOSIS — R627 Adult failure to thrive: Secondary | ICD-10-CM | POA: Diagnosis present

## 2022-07-14 DIAGNOSIS — I248 Other forms of acute ischemic heart disease: Secondary | ICD-10-CM | POA: Diagnosis present

## 2022-07-14 DIAGNOSIS — N132 Hydronephrosis with renal and ureteral calculous obstruction: Secondary | ICD-10-CM | POA: Diagnosis not present

## 2022-07-14 DIAGNOSIS — Z515 Encounter for palliative care: Secondary | ICD-10-CM

## 2022-07-14 DIAGNOSIS — R778 Other specified abnormalities of plasma proteins: Secondary | ICD-10-CM

## 2022-07-14 DIAGNOSIS — Z79890 Hormone replacement therapy (postmenopausal): Secondary | ICD-10-CM

## 2022-07-14 DIAGNOSIS — Z7189 Other specified counseling: Secondary | ICD-10-CM | POA: Diagnosis not present

## 2022-07-14 DIAGNOSIS — J690 Pneumonitis due to inhalation of food and vomit: Principal | ICD-10-CM | POA: Diagnosis present

## 2022-07-14 DIAGNOSIS — Z7982 Long term (current) use of aspirin: Secondary | ICD-10-CM

## 2022-07-14 DIAGNOSIS — R509 Fever, unspecified: Secondary | ICD-10-CM | POA: Diagnosis not present

## 2022-07-14 DIAGNOSIS — N179 Acute kidney failure, unspecified: Secondary | ICD-10-CM | POA: Diagnosis not present

## 2022-07-14 DIAGNOSIS — Z79899 Other long term (current) drug therapy: Secondary | ICD-10-CM

## 2022-07-14 DIAGNOSIS — E7800 Pure hypercholesterolemia, unspecified: Secondary | ICD-10-CM | POA: Diagnosis not present

## 2022-07-14 DIAGNOSIS — M810 Age-related osteoporosis without current pathological fracture: Secondary | ICD-10-CM | POA: Diagnosis not present

## 2022-07-14 DIAGNOSIS — R8289 Other abnormal findings on cytological and histological examination of urine: Secondary | ICD-10-CM | POA: Diagnosis not present

## 2022-07-14 DIAGNOSIS — R310 Gross hematuria: Secondary | ICD-10-CM | POA: Diagnosis present

## 2022-07-14 DIAGNOSIS — I82403 Acute embolism and thrombosis of unspecified deep veins of lower extremity, bilateral: Secondary | ICD-10-CM | POA: Diagnosis not present

## 2022-07-14 DIAGNOSIS — R609 Edema, unspecified: Secondary | ICD-10-CM | POA: Diagnosis not present

## 2022-07-14 DIAGNOSIS — R338 Other retention of urine: Secondary | ICD-10-CM | POA: Diagnosis present

## 2022-07-14 DIAGNOSIS — T83511A Infection and inflammatory reaction due to indwelling urethral catheter, initial encounter: Secondary | ICD-10-CM | POA: Diagnosis not present

## 2022-07-14 DIAGNOSIS — Z993 Dependence on wheelchair: Secondary | ICD-10-CM

## 2022-07-14 DIAGNOSIS — Z66 Do not resuscitate: Secondary | ICD-10-CM | POA: Diagnosis not present

## 2022-07-14 DIAGNOSIS — R9431 Abnormal electrocardiogram [ECG] [EKG]: Secondary | ICD-10-CM | POA: Diagnosis not present

## 2022-07-14 DIAGNOSIS — D62 Acute posthemorrhagic anemia: Secondary | ICD-10-CM | POA: Diagnosis present

## 2022-07-14 DIAGNOSIS — N401 Enlarged prostate with lower urinary tract symptoms: Secondary | ICD-10-CM | POA: Diagnosis present

## 2022-07-14 DIAGNOSIS — N390 Urinary tract infection, site not specified: Secondary | ICD-10-CM | POA: Diagnosis not present

## 2022-07-14 DIAGNOSIS — A419 Sepsis, unspecified organism: Secondary | ICD-10-CM

## 2022-07-14 DIAGNOSIS — K219 Gastro-esophageal reflux disease without esophagitis: Secondary | ICD-10-CM | POA: Diagnosis not present

## 2022-07-14 DIAGNOSIS — R339 Retention of urine, unspecified: Secondary | ICD-10-CM | POA: Diagnosis present

## 2022-07-14 DIAGNOSIS — I700 Atherosclerosis of aorta: Secondary | ICD-10-CM | POA: Diagnosis not present

## 2022-07-14 DIAGNOSIS — I509 Heart failure, unspecified: Secondary | ICD-10-CM | POA: Diagnosis present

## 2022-07-14 DIAGNOSIS — R319 Hematuria, unspecified: Secondary | ICD-10-CM | POA: Diagnosis not present

## 2022-07-14 DIAGNOSIS — D72828 Other elevated white blood cell count: Secondary | ICD-10-CM | POA: Diagnosis present

## 2022-07-14 DIAGNOSIS — R0609 Other forms of dyspnea: Secondary | ICD-10-CM | POA: Diagnosis not present

## 2022-07-14 DIAGNOSIS — J9601 Acute respiratory failure with hypoxia: Secondary | ICD-10-CM | POA: Diagnosis not present

## 2022-07-14 DIAGNOSIS — D494 Neoplasm of unspecified behavior of bladder: Secondary | ICD-10-CM | POA: Diagnosis not present

## 2022-07-14 DIAGNOSIS — I82413 Acute embolism and thrombosis of femoral vein, bilateral: Secondary | ICD-10-CM | POA: Diagnosis not present

## 2022-07-14 DIAGNOSIS — N1330 Unspecified hydronephrosis: Secondary | ICD-10-CM | POA: Diagnosis not present

## 2022-07-14 DIAGNOSIS — Z7901 Long term (current) use of anticoagulants: Secondary | ICD-10-CM | POA: Diagnosis not present

## 2022-07-14 DIAGNOSIS — R0602 Shortness of breath: Secondary | ICD-10-CM | POA: Diagnosis not present

## 2022-07-14 DIAGNOSIS — E039 Hypothyroidism, unspecified: Secondary | ICD-10-CM | POA: Diagnosis present

## 2022-07-14 DIAGNOSIS — R6520 Severe sepsis without septic shock: Secondary | ICD-10-CM

## 2022-07-14 DIAGNOSIS — N134 Hydroureter: Secondary | ICD-10-CM | POA: Diagnosis not present

## 2022-07-14 DIAGNOSIS — J9811 Atelectasis: Secondary | ICD-10-CM | POA: Diagnosis not present

## 2022-07-14 DIAGNOSIS — D72829 Elevated white blood cell count, unspecified: Secondary | ICD-10-CM | POA: Diagnosis not present

## 2022-07-14 DIAGNOSIS — I959 Hypotension, unspecified: Secondary | ICD-10-CM | POA: Diagnosis present

## 2022-07-14 DIAGNOSIS — Z0389 Encounter for observation for other suspected diseases and conditions ruled out: Secondary | ICD-10-CM | POA: Diagnosis not present

## 2022-07-14 DIAGNOSIS — E8770 Fluid overload, unspecified: Secondary | ICD-10-CM | POA: Diagnosis present

## 2022-07-14 DIAGNOSIS — J90 Pleural effusion, not elsewhere classified: Secondary | ICD-10-CM | POA: Diagnosis not present

## 2022-07-14 DIAGNOSIS — L538 Other specified erythematous conditions: Secondary | ICD-10-CM | POA: Diagnosis not present

## 2022-07-14 DIAGNOSIS — I82432 Acute embolism and thrombosis of left popliteal vein: Secondary | ICD-10-CM | POA: Diagnosis not present

## 2022-07-14 LAB — CBC WITH DIFFERENTIAL/PLATELET
Abs Immature Granulocytes: 0.14 10*3/uL — ABNORMAL HIGH (ref 0.00–0.07)
Basophils Absolute: 0 10*3/uL (ref 0.0–0.1)
Basophils Relative: 0 %
Eosinophils Absolute: 0 10*3/uL (ref 0.0–0.5)
Eosinophils Relative: 0 %
HCT: 27 % — ABNORMAL LOW (ref 39.0–52.0)
Hemoglobin: 8.5 g/dL — ABNORMAL LOW (ref 13.0–17.0)
Immature Granulocytes: 1 %
Lymphocytes Relative: 2 %
Lymphs Abs: 0.4 10*3/uL — ABNORMAL LOW (ref 0.7–4.0)
MCH: 29.5 pg (ref 26.0–34.0)
MCHC: 31.5 g/dL (ref 30.0–36.0)
MCV: 93.8 fL (ref 80.0–100.0)
Monocytes Absolute: 1 10*3/uL (ref 0.1–1.0)
Monocytes Relative: 6 %
Neutro Abs: 15.8 10*3/uL — ABNORMAL HIGH (ref 1.7–7.7)
Neutrophils Relative %: 91 %
Platelets: 249 10*3/uL (ref 150–400)
RBC: 2.88 MIL/uL — ABNORMAL LOW (ref 4.22–5.81)
RDW: 15.6 % — ABNORMAL HIGH (ref 11.5–15.5)
WBC: 17.3 10*3/uL — ABNORMAL HIGH (ref 4.0–10.5)
nRBC: 0 % (ref 0.0–0.2)

## 2022-07-14 LAB — URINALYSIS, ROUTINE W REFLEX MICROSCOPIC
Bilirubin Urine: NEGATIVE
Glucose, UA: NEGATIVE mg/dL
Ketones, ur: NEGATIVE mg/dL
Nitrite: NEGATIVE
Protein, ur: 100 mg/dL — AB
Specific Gravity, Urine: 1.012 (ref 1.005–1.030)
WBC, UA: >50 WBC/hpf — ABNORMAL HIGH (ref 0–5)
pH: 5 (ref 5.0–8.0)

## 2022-07-14 LAB — COMPREHENSIVE METABOLIC PANEL
ALT: 12 U/L (ref 0–44)
AST: 18 U/L (ref 15–41)
Albumin: 2.1 g/dL — ABNORMAL LOW (ref 3.5–5.0)
Alkaline Phosphatase: 101 U/L (ref 38–126)
Anion gap: 14 (ref 5–15)
BUN: 50 mg/dL — ABNORMAL HIGH (ref 8–23)
CO2: 22 mmol/L (ref 22–32)
Calcium: 8.4 mg/dL — ABNORMAL LOW (ref 8.9–10.3)
Chloride: 100 mmol/L (ref 98–111)
Creatinine, Ser: 2.92 mg/dL — ABNORMAL HIGH (ref 0.61–1.24)
GFR, Estimated: 19 mL/min — ABNORMAL LOW (ref >60)
Glucose, Bld: 112 mg/dL — ABNORMAL HIGH (ref 70–99)
Potassium: 5 mmol/L (ref 3.5–5.1)
Sodium: 136 mmol/L (ref 135–145)
Total Bilirubin: 0.6 mg/dL (ref 0.3–1.2)
Total Protein: 5.5 g/dL — ABNORMAL LOW (ref 6.5–8.1)

## 2022-07-14 LAB — LIPASE, BLOOD: Lipase: 21 U/L (ref 11–51)

## 2022-07-14 LAB — LACTIC ACID, PLASMA: Lactic Acid, Venous: 0.9 mmol/L (ref 0.5–1.9)

## 2022-07-14 LAB — TROPONIN I (HIGH SENSITIVITY)
Troponin I (High Sensitivity): 481 ng/L (ref <18)
Troponin I (High Sensitivity): 509 ng/L (ref <18)

## 2022-07-14 LAB — BRAIN NATRIURETIC PEPTIDE: B Natriuretic Peptide: 1049.9 pg/mL — ABNORMAL HIGH (ref 0.0–100.0)

## 2022-07-14 MED ORDER — LACTATED RINGERS IV BOLUS
1000.0000 mL | Freq: Once | INTRAVENOUS | Status: AC
  Administered 2022-07-14: 1000 mL via INTRAVENOUS

## 2022-07-14 MED ORDER — SODIUM CHLORIDE 0.9 % IV SOLN
2.0000 g | INTRAVENOUS | Status: DC
  Administered 2022-07-15: 2 g via INTRAVENOUS
  Filled 2022-07-14: qty 12.5

## 2022-07-14 MED ORDER — ASPIRIN 81 MG PO CHEW
324.0000 mg | CHEWABLE_TABLET | Freq: Once | ORAL | Status: AC
  Administered 2022-07-14: 324 mg via ORAL
  Filled 2022-07-14: qty 4

## 2022-07-14 MED ORDER — VANCOMYCIN HCL 1500 MG/300ML IV SOLN
1500.0000 mg | Freq: Once | INTRAVENOUS | Status: AC
  Administered 2022-07-14: 1500 mg via INTRAVENOUS
  Filled 2022-07-14: qty 300

## 2022-07-14 MED ORDER — VANCOMYCIN VARIABLE DOSE PER UNSTABLE RENAL FUNCTION (PHARMACIST DOSING): Status: DC

## 2022-07-14 MED ORDER — MELATONIN 3 MG PO TABS
3.0000 mg | ORAL_TABLET | Freq: Once | ORAL | Status: AC
  Administered 2022-07-14: 3 mg via ORAL
  Filled 2022-07-14: qty 1

## 2022-07-14 MED ORDER — SODIUM CHLORIDE 0.9 % IV SOLN
2.0000 g | Freq: Once | INTRAVENOUS | Status: AC
  Administered 2022-07-14: 2 g via INTRAVENOUS
  Filled 2022-07-14: qty 12.5

## 2022-07-14 NOTE — Assessment & Plan Note (Addendum)
Admit to inpatient med/tele bed. IV cefepime/vanco. Likely due to his large hiatal hernia. Blood cx obtained. May benefit from chest physiotherapy if he can tolerate it. Check non-contrast CT chest to better define his pneumonia and pleural effusion. Will check echo due to elevated BNP but he does not look like he is in acute CHF. Elevated BNP may be due to his AKI. ST consult to evaluate swallowing. Clear liquids for now.

## 2022-07-14 NOTE — Assessment & Plan Note (Signed)
Stable

## 2022-07-14 NOTE — Subjective & Objective (Signed)
CC: abnormal labs HPI: 88 year old male history of large hiatal hernia, urinary retention, prior history of prostate cancer, no chronic Foley catheter, presents to the ER from [place] home.  He was sent here due to abnormal labs including elevated serum creatinine.  Reportedly patient had a chest x-ray showed pulmonary edema.  Son States the patient has had decline over the last week.  Has not been eating well.  On arrival temp 98.5 heart rate 84 blood pressure 90/55 satting 100% on 4 L.  Labs showed a BNP of 1049, no baseline.  Sodium 136, potassium 5.0, BUN of 50, creatinine 2.9  Lipase normal at 21  Lactic acid 0.9  UA that was obtained from the patient's chronic Foley showed large cassette esterase, greater than 50 WBCs.  Patient's Foley catheter bag had purulent urine in it  CBC showed a white count of 17.3, hematoeight 8.5, platelets 249  Chest x-ray showed right basilar airspace disease.  He had a large hiatal hernia.  [facility] contacted for admission.

## 2022-07-14 NOTE — Assessment & Plan Note (Signed)
Unable to walk now. Wheelchair bound.

## 2022-07-14 NOTE — Assessment & Plan Note (Signed)
Due to catheter associated UTI and aspiration pneumonia. Repeat labs in AM.

## 2022-07-14 NOTE — Assessment & Plan Note (Signed)
Continue with foley catheter. Will have foley catheter changed out for new one.

## 2022-07-14 NOTE — Assessment & Plan Note (Signed)
Likely the cause of his aspiration pneumonia. Son states they were told that surgical correction of hiatal hernia would be too risky for this patient. I agree.

## 2022-07-14 NOTE — Progress Notes (Signed)
Pharmacy Antibiotic Note  Kevin Carr is a 88 y.o. male admitted on 07/12/2022 presenting from facility with concern for sepsis.  Pharmacy has been consulted for vancomycin and cefepime dosing.  Plan: Vancomycin 1500 mg IV x 1, then variable dosing due to unstable renal function Add MRSA PCR Cefepime 2g IV every 24 hours Monitor renal function, Cx and clinical progression to narrow Vancomycin levels as needed      Temp (24hrs), Avg:98.5 F (36.9 C), Min:98.5 F (36.9 C), Max:98.5 F (36.9 C)  Recent Labs  Lab 07/04/2022 1813  WBC 17.3*  CREATININE 2.92*  LATICACIDVEN 0.9    CrCl cannot be calculated (Unknown ideal weight.).    No Known Allergies  Bertis Ruddy, PharmD Clinical Pharmacist ED Pharmacist Phone # [phone] 07/13/2022 7:17 PM

## 2022-07-14 NOTE — Assessment & Plan Note (Signed)
Continue with IV cefepime/vanco. Urine cx obtained.

## 2022-07-14 NOTE — H&P (Addendum)
History and Physical    Kevin Carr JEH:[id number] DOB: 1926-09-16 DOA: 07/07/2022  DOS: the patient was seen and examined on 07/27/2022  PCP: Lujean Amel, MD   Patient coming from: [place]  I have personally briefly reviewed patient's old medical records in [place]  CC: abnormal labs HPI: 88 year old male history of large hiatal hernia, urinary retention, prior history of prostate cancer, no chronic Foley catheter, presents to the ER from [place] home.  He was sent here due to abnormal labs including elevated serum creatinine.  Reportedly patient had a chest x-ray showed pulmonary edema.  Son States the patient has had decline over the last week.  Has not been eating well.  On arrival temp 98.5 heart rate 84 blood pressure 90/55 satting 100% on 4 L.  Labs showed a BNP of 1049, no baseline.  Sodium 136, potassium 5.0, BUN of 50, creatinine 2.9  Lipase normal at 21  Lactic acid 0.9  UA that was obtained from the patient's chronic Foley showed large cassette esterase, greater than 50 WBCs.  Patient's Foley catheter bag had purulent urine in it  CBC showed a white count of 17.3, hematoeight 8.5, platelets 249  Chest x-ray showed right basilar airspace disease.  He had a large hiatal hernia.  [facility] hospitalist contacted for admission.     ED Course: CXR bilateral lower lobe pneumonia, BNP 1049  Review of Systems:  Review of Systems  Constitutional:  Positive for malaise/fatigue. Negative for chills and fever.  HENT: Negative.    Eyes: Negative.   Respiratory:  Positive for cough and shortness of breath.   Cardiovascular: Negative.   Gastrointestinal: Negative.   Genitourinary: Negative.   Musculoskeletal: Negative.   Skin: Negative.   Neurological:  Positive for weakness.  Endo/Heme/Allergies: Negative.   Psychiatric/Behavioral: Negative.    All other systems reviewed and are negative.   Past Medical History:  Diagnosis Date    Cancer [facility])    prostate   GERD (gastroesophageal reflux disease)    GI bleed    Hiatal hernia    Hypercholesteremia    Osteoporosis    Thyroid disease     Past Surgical History:  Procedure Laterality Date   APPENDECTOMY     TONSILLECTOMY       reports that he has never smoked. He has never used smokeless tobacco. He reports that he does not drink alcohol and does not use drugs.  No Known Allergies  No family history on file.  Prior to Admission medications   Medication Sig Start Date End Date Taking? Authorizing Provider  acetaminophen (TYLENOL) 500 MG tablet Take 500 mg by mouth 3 (three) times daily.   Yes [provider]  aspirin 81 MG tablet Take 81 mg by mouth daily.   Yes [provider]  DELSYM 30 MG/5ML liquid Take 60 mg by mouth every 12 (twelve) hours as needed for cough.   Yes [provider]  doxycycline (VIBRA-TABS) 100 MG tablet Take 100 mg by mouth 2 (two) times daily. 07/09/22 07/28/2022 Yes [provider]  finasteride (PROSCAR) 5 MG tablet Take 5 mg by mouth daily.   Yes [provider]  furosemide (LASIX) 10 MG/ML injection Inject 15 mg into the muscle 3 (three) times daily. 06/30/2022 07/17/22 Yes [provider]  ibuprofen (ADVIL) 200 MG tablet Take 400 mg by mouth every 6 (six) hours as needed (for pain).   Yes [provider]  levothyroxine (SYNTHROID, LEVOTHROID) 112 MCG tablet  Take 112 mcg by mouth daily before breakfast.   Yes [provider]  melatonin 5 MG TABS Take 5 mg by mouth at bedtime.   Yes [provider]  MUCINEX 600 MG 12 hr tablet Take 600 mg by mouth 2 (two) times daily. 07/09/22 07/16/22 Yes [provider]  Multiple Vitamin-Folic Acid TABS Take 1 tablet by mouth daily with breakfast.   Yes [provider]  omeprazole (PRILOSEC) 20 MG capsule Take 20 mg by mouth daily before breakfast.   Yes [provider]  polyethylene glycol (MIRALAX /  GLYCOLAX) 17 g packet Take 17 g by mouth daily as needed for mild constipation. Patient taking differently: Take 17 g by mouth daily as needed for mild constipation (mixed into 6 ounces of fluid). 06/05/22  Yes [place] Donnamarie Poag, DO  Tamsulosin HCl (FLOMAX) 0.4 MG CAPS Take 1 capsule (0.4 mg total) by mouth daily. 10/27/12  Yes Drenda Freeze, MD  traMADol (ULTRAM) 50 MG tablet Take 25 mg by mouth 2 (two) times daily as needed (for pain).   Yes [provider]  traMADol-acetaminophen (ULTRACET) 37.5-325 MG tablet Take 0.5 tablets by mouth 3 (three) times daily.   Yes [provider]  senna-docusate (SENOKOT-S) 8.6-50 MG tablet Take 1 tablet by mouth 2 (two) times daily. Patient not taking: Reported on 07/17/2022 06/05/22   [place] Imogene Gravelle J, [place]    Physical Exam: Vitals:   07/10/2022 1945 07/05/2022 2015 07/12/2022 2030 07/25/2022 2045  BP: 98/64 106/70 (!) 94/53 112/63  Pulse: 90 89 87 89  Resp: '17 17 20 18  '$ Temp:      TempSrc:      SpO2: 99% 98% 100% 100%    Physical Exam Vitals and nursing note reviewed.  Constitutional:      General: He is not in acute distress.    Appearance: He is ill-appearing. He is not toxic-appearing or diaphoretic.  HENT:     Head: Normocephalic and atraumatic.     Nose: Nose normal.  Cardiovascular:     Rate and Rhythm: Normal rate and regular rhythm.  Pulmonary:     Breath sounds: Examination of the right-middle field reveals rhonchi. Examination of the left-middle field reveals rhonchi. Examination of the right-lower field reveals rhonchi. Examination of the left-lower field reveals rhonchi. Rhonchi present.     Comments: Wet sounding cough Difficulty in coughing up sputum Abdominal:     General: There is no distension.     Tenderness: There is no abdominal tenderness. There is no guarding or rebound.  Musculoskeletal:     Right lower leg: Edema present.     Left lower leg: Edema present.  Skin:    General: Skin is warm and dry.      Capillary Refill: Capillary refill takes less than 2 seconds.  Neurological:     Mental Status: He is alert and oriented to person, place, and time.      Labs on Admission: I have personally reviewed following labs and imaging studies  CBC: Recent Labs  Lab 07/13/2022 1813  WBC 17.3*  NEUTROABS 15.8*  HGB 8.5*  HCT 27.0*  MCV 93.8  PLT 409   Basic Metabolic Panel: Recent Labs  Lab 07/05/2022 1813  NA 136  K 5.0  CL 100  CO2 22  GLUCOSE 112*  BUN 50*  CREATININE 2.92*  CALCIUM 8.4*   GFR: CrCl cannot be calculated (Unknown ideal weight.). Liver Function Tests: Recent Labs  Lab 07/07/2022 1813  AST 18  ALT 12  ALKPHOS 101  BILITOT 0.6  PROT 5.5*  ALBUMIN 2.1*   Recent Labs  Lab 07/04/2022 1813  LIPASE 21   No results for input(s): "AMMONIA" in the last 168 hours. Coagulation Profile: No results for input(s): "INR", "PROTIME" in the last 168 hours. Cardiac Enzymes: Recent Labs  Lab 07/20/2022 1813  TROPONINIHS 481*   BNP (last 3 results) No results for input(s): "PROBNP" in the last 8760 hours. HbA1C: No results for input(s): "HGBA1C" in the last 72 hours. CBG: No results for input(s): "GLUCAP" in the last 168 hours. Lipid Profile: No results for input(s): "CHOL", "HDL", "LDLCALC", "TRIG", "CHOLHDL", "LDLDIRECT" in the last 72 hours. Thyroid Function Tests: No results for input(s): "TSH", "T4TOTAL", "FREET4", "T3FREE", "THYROIDAB" in the last 72 hours. Anemia Panel: No results for input(s): "VITAMINB12", "FOLATE", "FERRITIN", "TIBC", "IRON", "RETICCTPCT" in the last 72 hours. Urine analysis:    Component Value Date/Time   COLORURINE YELLOW 07/11/2022 1825   APPEARANCEUR HAZY (A) 07/26/2022 1825   LABSPEC 1.012 07/19/2022 1825   PHURINE 5.0 07/17/2022 1825   GLUCOSEU NEGATIVE 07/09/2022 1825   HGBUR LARGE (A) 07/21/2022 1825   BILIRUBINUR NEGATIVE 06/29/2022 1825   KETONESUR NEGATIVE 06/29/2022 1825   PROTEINUR 100 (A) 07/23/2022 1825    UROBILINOGEN 1.0 10/27/2012 1111   NITRITE NEGATIVE 06/29/2022 1825   LEUKOCYTESUR LARGE (A) 07/28/2022 1825    Radiological Exams on Admission: I have personally reviewed images DG Chest Portable 1 View  Result Date: 07/12/2022 CLINICAL DATA:  Shortness of breath. EXAM: PORTABLE CHEST 1 VIEW COMPARISON:  Chest radiograph and chest CT dated 05/30/2022. FINDINGS: The heart size is normal. Vascular calcifications are seen in the aortic arch. The stomach is again noted in the left thorax. There is a right pleural effusion with associated atelectasis/airspace disease. There is left basilar atelectasis/airspace disease. A left pleural effusion may contribute. There is no pneumothorax. Degenerative changes are seen in the spine. IMPRESSION: 1. Right pleural effusion with associated atelectasis/airspace disease. 2. Left basilar atelectasis/airspace disease and possible left pleural effusion. Aortic Atherosclerosis (ICD10-I70.0). Electronically Signed   By: Zerita Boers M.D.   On: 07/10/2022 18:57    EKG: My personal interpretation of EKG shows: NSR    Assessment/Plan Principal Problem:   Aspiration pneumonia (HCC) Active Problems:   UTI (urinary tract infection) due to urinary indwelling Foley catheter (HCC)   Acute respiratory failure with hypoxia (HCC)   Sepsis with acute renal failure without septic shock (HCC)   AKI (acute kidney injury) ([place])   Hypothyroidism   Large hiatal hernia   Wheelchair dependence   Acute urinary retention    Assessment and Plan: * Aspiration pneumonia ([place]) Admit to inpatient med/tele bed. IV cefepime/vanco. Likely due to his large hiatal hernia. Blood cx obtained. May benefit from chest physiotherapy if he can tolerate it. Check non-contrast CT chest to better define his pneumonia and pleural effusion. Will check echo due to elevated BNP but he does not look like he is in acute CHF. Elevated BNP may be due to his AKI. ST consult to evaluate swallowing. Clear  liquids for now.  Sepsis with acute renal failure without septic shock (HCC) Due to catheter associated UTI and aspiration pneumonia. Repeat labs in AM.  Acute respiratory failure with hypoxia ([place]) Continue with supplemental O2. Pt does not wear O2.  UTI (urinary tract infection) due to urinary indwelling Foley catheter ([place]) Continue with IV cefepime/vanco. Urine cx obtained.  Wheelchair dependence Unable to walk now. Wheelchair bound.  Large hiatal hernia Likely the cause of his aspiration pneumonia. Son states they were told that surgical correction of hiatal hernia would be too risky for this patient. I agree.  Hypothyroidism Stable.  AKI (acute kidney injury) ([place]) Continue with IVF. Change out foley catheter.  Acute urinary retention Continue with foley catheter. Will have foley catheter changed out for new one.   DVT prophylaxis: SQ Heparin Code Status: Full Code Family Communication: discussed with pt and son Kevin Carr. Discussed that in event pt had a cardiac arrest, he would likely not survive. Pt and son want to continue Full Code status.  Disposition Plan: return to SNF  Consults called: none  Admission status: Inpatient, Telemetry bed   Kristopher Oppenheim, DO [facility] 07/09/2022, 9:09 PM

## 2022-07-14 NOTE — Assessment & Plan Note (Signed)
Continue with supplemental O2. Pt does not wear O2.

## 2022-07-14 NOTE — ED Provider Notes (Signed)
[facility] EMERGENCY DEPARTMENT Provider Note   CSN: [id number] Arrival date & time: 06/30/2022  1802     History {Add pertinent medical, surgical, social history, OB history to HPI:1} Chief Complaint  Patient presents with   Weakness    Kevin Carr is a 88 y.o. male.  Patient sent from facility with generalized decline over the past 1 week.  He was reported to have increased swelling, difficulty breathing, decreased p.o. intake.  Chest x-ray apparently showed pulmonary edema and lab work showed renal failure.  Patient complains of feeling generally weak but denies any pain.  Denies any shortness of breath.  States he has not been coughing up.  Denies any chest pain, abdominal pain, nausea or vomiting.  He states he does not wear oxygen at baseline but was placed on a as facility today for unknown reasons.  Has a moist cough.  Denies any chest pain.  Denies any abdominal pain.  Denies any vomiting or diarrhea.  Patient has a history of prostate cancer, hiatal hernia, previous GI bleed and GERD.  Hospitalized in August for sepsis from UTI  The history is provided by the patient and the EMS personnel. The history is limited by the condition of the patient.  Weakness      Home Medications Prior to Admission medications   Medication Sig Start Date End Date Taking? Authorizing Provider  aspirin 81 MG tablet Take 81 mg by mouth daily.    [provider]  finasteride (PROSCAR) 5 MG tablet Take 5 mg by mouth daily.    [provider]  ibuprofen (ADVIL,MOTRIN) 600 MG tablet Take 1 tablet (600 mg total) by mouth every 6 (six) hours as needed for pain. 10/27/12   Drenda Freeze, MD  levothyroxine (SYNTHROID, LEVOTHROID) 112 MCG tablet Take 112 mcg by mouth daily.    [provider]  Multiple Vitamins-Minerals (MULTIVITAMIN WITH MINERALS) tablet Take 1 tablet by mouth daily.    [provider]  omeprazole (PRILOSEC) 20 MG capsule  Take 20 mg by mouth daily.    [provider]  polyethylene glycol (MIRALAX / GLYCOLAX) 17 g packet Take 17 g by mouth daily as needed for mild constipation. 06/05/22   [place] Eric J, DO  senna-docusate (SENOKOT-S) 8.6-50 MG tablet Take 1 tablet by mouth 2 (two) times daily. 06/05/22   [place] Donnamarie Poag, DO  Tamsulosin HCl (FLOMAX) 0.4 MG CAPS Take 1 capsule (0.4 mg total) by mouth daily. 10/27/12   Drenda Freeze, MD      Allergies    Patient has no known allergies.    Review of Systems   Review of Systems  Unable to perform ROS: Mental status change  Neurological:  Positive for weakness.    Physical Exam Updated Vital Signs BP (!) 90/55 (BP Location: Right Arm)   Pulse 84   Resp 18   SpO2 100%  Physical Exam Vitals and nursing note reviewed.  Constitutional:      General: He is in acute distress.     Appearance: He is well-developed. He is ill-appearing.     Comments: Chronically ill-appearing, moist cough  HENT:     Head: Normocephalic and atraumatic.     Mouth/Throat:     Pharynx: No oropharyngeal exudate.  Eyes:     Conjunctiva/sclera: Conjunctivae normal.     Pupils: Pupils are equal, round, and reactive to light.  Neck:     Comments: No meningismus. Cardiovascular:     Rate and  Rhythm: Regular rhythm. Tachycardia present.     Heart sounds: Normal heart sounds. No murmur heard. Pulmonary:     Effort: Pulmonary effort is normal. No respiratory distress.     Breath sounds: Rhonchi and rales present.  Abdominal:     General: There is distension.     Palpations: Abdomen is soft.     Tenderness: There is no abdominal tenderness. There is no guarding or rebound.  Musculoskeletal:        General: No tenderness. Normal range of motion.     Cervical back: Normal range of motion and neck supple.  Skin:    General: Skin is warm.  Neurological:     Mental Status: He is alert.     Cranial Nerves: No cranial nerve deficit.     Motor: No abnormal muscle tone.      Coordination: Coordination normal.     Comments:  5/5 strength throughout. CN 2-12 intact.Equal grip strength.  Oriented to person and place.  Psychiatric:        Behavior: Behavior normal.     ED Results / Procedures / Treatments   Labs (all labs ordered are listed, but only abnormal results are displayed) Labs Reviewed  CBC WITH DIFFERENTIAL/PLATELET - Abnormal; Notable for the following components:      Result Value   WBC 17.3 (*)    RBC 2.88 (*)    Hemoglobin 8.5 (*)    HCT 27.0 (*)    RDW 15.6 (*)    Neutro Abs 15.8 (*)    Lymphs Abs 0.4 (*)    Abs Immature Granulocytes 0.14 (*)    All other components within normal limits  COMPREHENSIVE METABOLIC PANEL - Abnormal; Notable for the following components:   Glucose, Bld 112 (*)    BUN 50 (*)    Creatinine, Ser 2.92 (*)    Calcium 8.4 (*)    Total Protein 5.5 (*)    Albumin 2.1 (*)    GFR, Estimated 19 (*)    All other components within normal limits  BRAIN NATRIURETIC PEPTIDE - Abnormal; Notable for the following components:   B Natriuretic Peptide 1,049.9 (*)    All other components within normal limits  URINALYSIS, ROUTINE W REFLEX MICROSCOPIC - Abnormal; Notable for the following components:   APPearance HAZY (*)    Hgb urine dipstick LARGE (*)    Protein, ur 100 (*)    Leukocytes,Ua LARGE (*)    WBC, UA >50 (*)    Bacteria, UA RARE (*)    All other components within normal limits  TROPONIN I (HIGH SENSITIVITY) - Abnormal; Notable for the following components:   Troponin I (High Sensitivity) 481 (*)    All other components within normal limits  CULTURE, BLOOD (ROUTINE X 2)  CULTURE, BLOOD (ROUTINE X 2)  URINE CULTURE  MRSA NEXT GEN BY PCR, NASAL  LACTIC ACID, PLASMA  LIPASE, BLOOD  LACTIC ACID, PLASMA    EKG EKG Interpretation  Date/Time:  Saturday July 14 2022 18:23:01 EDT Ventricular Rate:  84 PR Interval:  156 QRS Duration: 98 QT Interval:  366 QTC Calculation: 433 R Axis:   -35 Text  Interpretation: Sinus rhythm Left ventricular hypertrophy Anterior Q waves, possibly due to LVH No significant change was found Confirmed by Ezequiel Essex [id number]) on 07/08/2022 7:14:46 PM  Radiology DG Chest Portable 1 View  Result Date: 07/25/2022 CLINICAL DATA:  Shortness of breath. EXAM: PORTABLE CHEST 1 VIEW COMPARISON:  Chest radiograph and chest CT dated 05/30/2022.  FINDINGS: The heart size is normal. Vascular calcifications are seen in the aortic arch. The stomach is again noted in the left thorax. There is a right pleural effusion with associated atelectasis/airspace disease. There is left basilar atelectasis/airspace disease. A left pleural effusion may contribute. There is no pneumothorax. Degenerative changes are seen in the spine. IMPRESSION: 1. Right pleural effusion with associated atelectasis/airspace disease. 2. Left basilar atelectasis/airspace disease and possible left pleural effusion. Aortic Atherosclerosis (ICD10-I70.0). Electronically Signed   By: Zerita Boers M.D.   On: 07/27/2022 18:57    Procedures Procedures  {Document cardiac monitor, telemetry assessment procedure when appropriate:1}  Medications Ordered in ED Medications  lactated ringers bolus 1,000 mL (has no administration in time range)    ED Course/ Medical Decision Making/ A&P                           Medical Decision Making Amount and/or Complexity of Data Reviewed Labs: ordered. Decision-making details documented in ED Course. Radiology: ordered and independent interpretation performed. Decision-making details documented in ED Course. ECG/medicine tests: ordered and independent interpretation performed. Decision-making details documented in ED Course.  Risk OTC drugs. Prescription drug management.   Patient from facility with generalized weakness, concern for volume overload, concern for UTI and renal failure.  On arrival blood pressure soft in the 90s.  No hypoxia or increased work of breathing.   Denies chest pain.  Patient does have rhonchi on exam with increased work of breathing.  We will pursue infectious work-up with blood cultures, antibiotics and IV fluids after cultures are obtained.  Chest x-ray is concerning for basilar infiltrates bilaterally.  Question aspiration  Lactate is normal but does have leukocytosis.  Acute renal failure up to 2.9.  Will place Foley catheter. Given elevated BNP we will hydrate judiciously.  No echocardiogram in system  Treat empirically for healthcare associated pneumonia as well as UTI with vancomycin and cefepime.  {Document critical care time when appropriate:1} {Document review of labs and clinical decision tools ie heart score, Chads2Vasc2 etc:1}  {Document your independent review of radiology images, and any outside records:1} {Document your discussion with family members, caretakers, and with consultants:1} {Document social determinants of health affecting pt's care:1} {Document your decision making why or why not admission, treatments were needed:1} Final Clinical Impression(s) / ED Diagnoses Final diagnoses:  None    Rx / DC Orders ED Discharge Orders     None

## 2022-07-14 NOTE — Assessment & Plan Note (Signed)
Continue with IVF. Change out foley catheter.

## 2022-07-14 NOTE — ED Triage Notes (Signed)
Patient was bib [facility] from [facility]. The staff called out saying that the patient has had a decline over the last week.staff physician ordered blood work and chest xray showing pulmonary edema and possible renal failure. The physicians there are concerned about maybe acute renal failure or sepsis. Pt complains of weakness as well. VSS for ems.

## 2022-07-14 NOTE — ED Notes (Signed)
MD at bedside. 

## 2022-07-15 ENCOUNTER — Encounter (HOSPITAL_COMMUNITY): Payer: Self-pay | Admitting: Internal Medicine

## 2022-07-15 ENCOUNTER — Inpatient Hospital Stay (HOSPITAL_COMMUNITY): Payer: Medicare Other

## 2022-07-15 DIAGNOSIS — N179 Acute kidney failure, unspecified: Secondary | ICD-10-CM | POA: Diagnosis not present

## 2022-07-15 DIAGNOSIS — J690 Pneumonitis due to inhalation of food and vomit: Secondary | ICD-10-CM | POA: Diagnosis not present

## 2022-07-15 DIAGNOSIS — Z7189 Other specified counseling: Secondary | ICD-10-CM

## 2022-07-15 DIAGNOSIS — J9601 Acute respiratory failure with hypoxia: Secondary | ICD-10-CM | POA: Diagnosis not present

## 2022-07-15 DIAGNOSIS — R778 Other specified abnormalities of plasma proteins: Secondary | ICD-10-CM

## 2022-07-15 DIAGNOSIS — E039 Hypothyroidism, unspecified: Secondary | ICD-10-CM | POA: Diagnosis not present

## 2022-07-15 LAB — CBC WITH DIFFERENTIAL/PLATELET
Abs Immature Granulocytes: 0.11 10*3/uL — ABNORMAL HIGH (ref 0.00–0.07)
Basophils Absolute: 0.1 10*3/uL (ref 0.0–0.1)
Basophils Relative: 0 %
Eosinophils Absolute: 0.1 10*3/uL (ref 0.0–0.5)
Eosinophils Relative: 1 %
HCT: 27.8 % — ABNORMAL LOW (ref 39.0–52.0)
Hemoglobin: 8.9 g/dL — ABNORMAL LOW (ref 13.0–17.0)
Immature Granulocytes: 1 %
Lymphocytes Relative: 3 %
Lymphs Abs: 0.4 10*3/uL — ABNORMAL LOW (ref 0.7–4.0)
MCH: 30.1 pg (ref 26.0–34.0)
MCHC: 32 g/dL (ref 30.0–36.0)
MCV: 93.9 fL (ref 80.0–100.0)
Monocytes Absolute: 0.9 10*3/uL (ref 0.1–1.0)
Monocytes Relative: 7 %
Neutro Abs: 12.6 10*3/uL — ABNORMAL HIGH (ref 1.7–7.7)
Neutrophils Relative %: 88 %
Platelets: 223 10*3/uL (ref 150–400)
RBC: 2.96 MIL/uL — ABNORMAL LOW (ref 4.22–5.81)
RDW: 15.7 % — ABNORMAL HIGH (ref 11.5–15.5)
WBC: 14.2 10*3/uL — ABNORMAL HIGH (ref 4.0–10.5)
nRBC: 0 % (ref 0.0–0.2)

## 2022-07-15 LAB — MAGNESIUM: Magnesium: 2 mg/dL (ref 1.7–2.4)

## 2022-07-15 LAB — COMPREHENSIVE METABOLIC PANEL
ALT: 9 U/L (ref 0–44)
AST: 20 U/L (ref 15–41)
Albumin: 2.1 g/dL — ABNORMAL LOW (ref 3.5–5.0)
Alkaline Phosphatase: 100 U/L (ref 38–126)
Anion gap: 12 (ref 5–15)
BUN: 44 mg/dL — ABNORMAL HIGH (ref 8–23)
CO2: 21 mmol/L — ABNORMAL LOW (ref 22–32)
Calcium: 8.4 mg/dL — ABNORMAL LOW (ref 8.9–10.3)
Chloride: 103 mmol/L (ref 98–111)
Creatinine, Ser: 1.88 mg/dL — ABNORMAL HIGH (ref 0.61–1.24)
GFR, Estimated: 33 mL/min — ABNORMAL LOW (ref >60)
Glucose, Bld: 108 mg/dL — ABNORMAL HIGH (ref 70–99)
Potassium: 4.4 mmol/L (ref 3.5–5.1)
Sodium: 136 mmol/L (ref 135–145)
Total Bilirubin: 0.3 mg/dL (ref 0.3–1.2)
Total Protein: 5.5 g/dL — ABNORMAL LOW (ref 6.5–8.1)

## 2022-07-15 LAB — PROCALCITONIN: Procalcitonin: 0.45 ng/mL

## 2022-07-15 LAB — LACTIC ACID, PLASMA: Lactic Acid, Venous: 0.9 mmol/L (ref 0.5–1.9)

## 2022-07-15 LAB — TROPONIN I (HIGH SENSITIVITY): Troponin I (High Sensitivity): 288 ng/L (ref <18)

## 2022-07-15 LAB — MRSA NEXT GEN BY PCR, NASAL: MRSA by PCR Next Gen: NOT DETECTED

## 2022-07-15 MED ORDER — ALBUTEROL SULFATE (2.5 MG/3ML) 0.083% IN NEBU
2.5000 mg | INHALATION_SOLUTION | RESPIRATORY_TRACT | Status: DC | PRN
  Filled 2022-07-15: qty 3

## 2022-07-15 MED ORDER — ONDANSETRON HCL 4 MG/2ML IJ SOLN: 4.0000 mg | Freq: Four times a day (QID) | INTRAMUSCULAR | Status: DC | PRN

## 2022-07-15 MED ORDER — ACETAMINOPHEN 325 MG PO TABS
650.0000 mg | ORAL_TABLET | Freq: Four times a day (QID) | ORAL | Status: DC | PRN
  Filled 2022-07-15: qty 2

## 2022-07-15 MED ORDER — FINASTERIDE 5 MG PO TABS
5.0000 mg | ORAL_TABLET | Freq: Every day | ORAL | Status: DC
  Administered 2022-07-15 – 2022-07-21 (×7): 5 mg via ORAL
  Filled 2022-07-15 (×8): qty 1

## 2022-07-15 MED ORDER — HEPARIN SODIUM (PORCINE) 5000 UNIT/ML IJ SOLN
5000.0000 [IU] | Freq: Three times a day (TID) | INTRAMUSCULAR | Status: DC
  Administered 2022-07-15 – 2022-07-17 (×7): 5000 [IU] via SUBCUTANEOUS
  Filled 2022-07-15 (×7): qty 1

## 2022-07-15 MED ORDER — METRONIDAZOLE 500 MG/100ML IV SOLN
500.0000 mg | Freq: Two times a day (BID) | INTRAVENOUS | Status: DC
  Administered 2022-07-15 – 2022-07-18 (×7): 500 mg via INTRAVENOUS
  Filled 2022-07-15 (×7): qty 100

## 2022-07-15 MED ORDER — LEVOTHYROXINE SODIUM 112 MCG PO TABS
112.0000 ug | ORAL_TABLET | Freq: Every day | ORAL | Status: DC
  Administered 2022-07-15 – 2022-07-22 (×8): 112 ug via ORAL
  Filled 2022-07-15 (×7): qty 1

## 2022-07-15 MED ORDER — ACETAMINOPHEN 650 MG RE SUPP: 650.0000 mg | Freq: Four times a day (QID) | RECTAL | Status: DC | PRN

## 2022-07-15 MED ORDER — LIDOCAINE HCL URETHRAL/MUCOSAL 2 % EX GEL
1.0000 | Freq: Once | CUTANEOUS | Status: DC
  Filled 2022-07-15: qty 6

## 2022-07-15 MED ORDER — ONDANSETRON HCL 4 MG PO TABS: 4.0000 mg | ORAL_TABLET | Freq: Four times a day (QID) | ORAL | Status: DC | PRN

## 2022-07-15 NOTE — Progress Notes (Addendum)
PROGRESS NOTE  Kevin Carr JZP:[id number] DOB: 1926-06-29   PCP: Lujean Amel, MD  Patient is from: [facility]  DOA: 07/05/2022 LOS: 1  Chief complaints Chief Complaint  Patient presents with   Weakness     Brief Narrative / Interim history: 88 year old M with PMH of large hiatal hernia, urinary retention/bilateral hydroureteronephrosis with chronic Foley catheter and prostate cancer presenting with abnormal labs including elevated creatinine and chest x-ray concerning for pulmonary edema, and admitted for aspiration pneumonia and AKI.  Family reports overall decline over the last 2 months, mainly over the last 1 week.   In ED, slightly hypotensive to 90/55.  Saturating at 100% on 4 L. Cr 2.92 (baseline 1.0).  BUN 50.  WBC 17.3 with left shift.  Hgb 8.5 (baseline 10-11).  Troponin 481.  BNP 1050.  Lactic acid 0.9.  EKG NSR with LVH but no acute ischemic finding.  UA with large LE and large Hgb with 21-50 RBC per hpf but obtained from chronic Foley.  Foley catheter exchanged in ED.  CXR showed large hiatal hernia, right pleural effusion with associated atelectasis/airspace disease and left basilar atelectasis/airspace disease.  Cultures obtained.  CT chest ordered.  Patient was started on IV cefepime and admitted.   Subjective: Seen and examined earlier this morning.  No major events overnight of this morning.  No major complaints.  He reports shortness of breath "sometimes".  He denies chest pain.  He denies dysphagia, nausea, vomiting or abdominal pain.  He denies UTI symptoms.  Patient's son at bedside.  Objective: Vitals:   07/06/2022 2241 07/15/22 0533 07/15/22 0800 07/15/22 0911  BP: (!) 95/41 (!) 93/59  (!) 90/59  Pulse: 84 81  81  Resp: 15 17    Temp: (!) 97.5 F (36.4 C) 98 F (36.7 C)  (!) 97.2 F (36.2 C)  TempSrc: Oral   Oral  SpO2: 93% 91%  (!) 70%  Weight:   75.5 kg   Height:   '5\' 11"'$  (1.803 m)     Examination:  GENERAL: Appears frail.  No apparent  distress. HEENT: MMM.  Vision and hearing grossly intact.  NECK: Supple.  No apparent JVD.  RESP:  No IWOB.  Rhonchorous. CVS:  RRR. Heart sounds normal.  ABD/GI/GU: BS+. Abd soft, NTND.  MSK/EXT:  Moves extremities. No apparent deformity.  1+ edema in all extremities SKIN: no apparent skin lesion or wound NEURO: Awake and oriented to self, place, family, month, year and the president.  No apparent focal neuro deficit. PSYCH: Calm. Normal affect.      Procedures:  None  Microbiology summarized: MRSA PCR screen nonreactive. Blood cultures NGTD. Urine culture pending  Assessment and plan: Principal Problem:   Aspiration pneumonia ([place]) Active Problems:   UTI (urinary tract infection) due to urinary indwelling Foley catheter ([facility])   Acute respiratory failure with hypoxia ([facility])   AKI (acute kidney injury) ([place])   Hypothyroidism   Large hiatal hernia   Wheelchair dependence   Urinary retention   Acute respiratory failure with hypoxia likely due to aspiration pneumonia, pleural effusion, atelectasis and possible acute unknown type CHF.  Low suspicion for PE.  CXR with significant right pleural effusion and RLL opacity.  Given patient's known large hiatal hernia, significant concern for aspiration pneumonia.  Pleural effusion, elevated BNP and troponin also raises concern for acute CHF.  Low suspicion for PE. -Continue IV cefepime -IV Flagyl for anaerobic coverage -Wean oxygen as able, incentive telemetry, OOB/PT/OT -Thoracocentesis with fluid study -  Follow echocardiogram -Check procalcitonin -Aspiration precaution and SLP eval -Patient has not met sepsis criteria yet.  Sepsis ruled out.  Right pleural effusion-unclear if this is CHF related or parapneumonic. -Thoracocentesis and fluid study as above -Follow echocardiogram -May consider IV diuretics with albumin if BP allows.  Catheter associated UTI and patient with urinary retention/hydronephrosis-UA concerning but  obtained from chronic Foley.  He has significant leukocytosis with left shift could also be from pneumonia.  Regarding the change in NAD. -Antibiotics as above  AKI/azotemia: Likely prerenal from poor p.o. intake.  Seems he is also on diuretics and ibuprofen Recent Labs    05/30/22 1353 05/31/22 0437 06/01/22 0812 06/02/22 0336 06/03/22 0854 06/04/22 0759 06/05/22 0617 07/22/2022 1813  BUN 170* 125* 76* 48* 30* 26* 24* 50*  CREATININE 4.06* 2.44* 1.43* 1.16 1.02 1.35* 1.04 2.92*  -Closely monitor urine output -Monitor renal function -Check renal ultrasound  Elevated troponin: Patient without chest pain.  EKG without acute finding.  Suspect demand ischemia from hypoxia and infection as well as delayed clearance from AKI.  No significant delta either. -Recheck troponin -Follow echocardiogram  Large hiatal hernia:  -Aspiration precaution and SLP eval  Goal of care counseling: Extensive discussion about CODE STATUS including pros and cons of CPR and intubation with patient and patient's son at bedside.  Recommended DNR/DNI.  Son understands the risk and benefit but wants to honor patient's wish. -Palliative medicine consulted.  Body mass index is 23.21 kg/m.    DVT prophylaxis:  heparin injection 5,000 Units Start: 07/15/22 0900 SCDs Start: 07/15/22 0815  Code Status: Full code Family Communication: Updated patient's son at bedside Level of care: Telemetry Medical Status is: Inpatient Remains inpatient appropriate because: Aspiration pneumonia and catheter associated UTI   Final disposition: TBD Consultants:  Dimensional radiology Palliative medicine  Sch Meds:  Scheduled Meds:  heparin  5,000 Units Subcutaneous Q8H   levothyroxine  112 mcg Oral Q0600   lidocaine  1 Application Urethral Once   vancomycin variable dose per unstable renal function (pharmacist dosing)   Does not apply See admin instructions   Continuous Infusions:  ceFEPime (MAXIPIME) IV      metronidazole 500 mg (07/15/22 0944)   PRN Meds:.acetaminophen **OR** acetaminophen, albuterol, ondansetron **OR** ondansetron (ZOFRAN) IV  Antimicrobials: Anti-infectives (From admission, onward)    Start     Dose/Rate Route Frequency Ordered Stop   07/15/22 2000  ceFEPIme (MAXIPIME) 2 g in sodium chloride 0.9 % 100 mL IVPB        2 g 200 mL/hr over 30 Minutes Intravenous Every 24 hours 07/25/2022 1922     07/15/22 0915  metroNIDAZOLE (FLAGYL) IVPB 500 mg        500 mg 100 mL/hr over 60 Minutes Intravenous Every 12 hours 07/15/22 0815 07/20/22 0914   07/07/2022 1930  vancomycin (VANCOREADY) IVPB 1500 mg/300 mL        1,500 mg 150 mL/hr over 120 Minutes Intravenous  Once 07/01/2022 1920 07/17/2022 2356   06/30/2022 1922  vancomycin variable dose per unstable renal function (pharmacist dosing)         Does not apply See admin instructions 07/27/2022 1922     07/17/2022 1915  ceFEPIme (MAXIPIME) 2 g in sodium chloride 0.9 % 100 mL IVPB        2 g 200 mL/hr over 30 Minutes Intravenous  Once 07/16/2022 1913 07/26/2022 2354        I have personally reviewed the following labs and images: CBC: Recent Labs  Lab 07/11/2022 1813 07/15/22 1132  WBC 17.3* 14.2*  NEUTROABS 15.8* 12.6*  HGB 8.5* 8.9*  HCT 27.0* 27.8*  MCV 93.8 93.9  PLT 249 223   BMP &GFR Recent Labs  Lab 06/30/2022 1813  NA 136  K 5.0  CL 100  CO2 22  GLUCOSE 112*  BUN 50*  CREATININE 2.92*  CALCIUM 8.4*   Estimated Creatinine Clearance: 16.5 mL/min (A) (by C-G formula based on SCr of 2.92 mg/dL (H)). Liver & Pancreas: Recent Labs  Lab 07/23/2022 1813  AST 18  ALT 12  ALKPHOS 101  BILITOT 0.6  PROT 5.5*  ALBUMIN 2.1*   Recent Labs  Lab 07/23/2022 1813  LIPASE 21   No results for input(s): "AMMONIA" in the last 168 hours. Diabetic: No results for input(s): "HGBA1C" in the last 72 hours. No results for input(s): "GLUCAP" in the last 168 hours. Cardiac Enzymes: No results for input(s): "CKTOTAL", "CKMB",  "CKMBINDEX", "TROPONINI" in the last 168 hours. No results for input(s): "PROBNP" in the last 8760 hours. Coagulation Profile: No results for input(s): "INR", "PROTIME" in the last 168 hours. Thyroid Function Tests: No results for input(s): "TSH", "T4TOTAL", "FREET4", "T3FREE", "THYROIDAB" in the last 72 hours. Lipid Profile: No results for input(s): "CHOL", "HDL", "LDLCALC", "TRIG", "CHOLHDL", "LDLDIRECT" in the last 72 hours. Anemia Panel: No results for input(s): "VITAMINB12", "FOLATE", "FERRITIN", "TIBC", "IRON", "RETICCTPCT" in the last 72 hours. Urine analysis:    Component Value Date/Time   COLORURINE YELLOW 07/10/2022 1825   APPEARANCEUR HAZY (A) 07/03/2022 1825   LABSPEC 1.012 07/23/2022 1825   PHURINE 5.0 07/22/2022 1825   GLUCOSEU NEGATIVE 07/27/2022 1825   HGBUR LARGE (A) 07/03/2022 1825   BILIRUBINUR NEGATIVE 07/18/2022 1825   KETONESUR NEGATIVE 06/30/2022 1825   PROTEINUR 100 (A) 07/04/2022 1825   UROBILINOGEN 1.0 10/27/2012 1111   NITRITE NEGATIVE 07/07/2022 1825   LEUKOCYTESUR LARGE (A) 07/13/2022 1825   Sepsis Labs: Invalid input(s): "PROCALCITONIN", "LACTICIDVEN"  Microbiology: Recent Results (from the past 240 hour(s))  Blood culture (routine x 2)     Status: None (Preliminary result)   Collection Time: 06/30/2022  6:14 PM   Specimen: BLOOD RIGHT HAND  Result Value Ref Range Status   Specimen Description BLOOD RIGHT HAND  Final   Special Requests   Final    BOTTLES DRAWN AEROBIC AND ANAEROBIC Blood Culture adequate volume   Culture   Final    NO GROWTH < 12 HOURS Performed at [facility], [address] [place], [place] [address]    Report Status PENDING  Incomplete  MRSA Next Gen by PCR, Nasal     Status: None   Collection Time: 07/13/2022 11:02 PM   Specimen: Nasal Mucosa; Nasal Swab  Result Value Ref Range Status   MRSA by PCR Next Gen NOT DETECTED NOT DETECTED Final    Comment: (NOTE) The GeneXpert MRSA Assay (FDA approved for NASAL  specimens only), is one component of a comprehensive MRSA colonization surveillance program. It is not intended to diagnose MRSA infection nor to guide or monitor treatment for MRSA infections. Test performance is not FDA approved in patients less than 18 years old. Performed at [facility], [address] [place], [place] [address]   Blood culture (routine x 2)     Status: None (Preliminary result)   Collection Time: 07/09/2022 11:19 PM   Specimen: BLOOD  Result Value Ref Range Status   Specimen Description BLOOD LEFT ANTECUBITAL  Final   Special Requests  Final    BOTTLES DRAWN AEROBIC AND ANAEROBIC Blood Culture adequate volume   Culture   Final    NO GROWTH < 12 HOURS Performed at [facility], [address] [place], [place] [address]    Report Status PENDING  Incomplete    Radiology Studies: CT CHEST WO CONTRAST  Result Date: 07/15/2022 CLINICAL DATA:  Follow-up pleural effusions EXAM: CT CHEST WITHOUT CONTRAST TECHNIQUE: Multidetector CT imaging of the chest was performed following the standard protocol without IV contrast. RADIATION DOSE REDUCTION: This exam was performed according to the departmental dose-optimization program which includes automated exposure control, adjustment of the mA and/or kV according to patient size and/or use of iterative reconstruction technique. COMPARISON:  Chest x-ray from the previous day, CT from 05/30/2022 FINDINGS: Cardiovascular: Somewhat limited due to lack of IV contrast. Diffuse atherosclerotic calcifications are noted of the aorta. No aneurysmal dilatation or dissection is seen. No cardiac enlargement is noted. Coronary calcifications are seen. Mediastinum/Nodes: Thoracic inlet is within normal limits. No hilar or mediastinal adenopathy is noted. The esophagus is within normal limits. Large hiatal hernia is noted with the majority of the stomach within the left chest cavity as well as a loop of transverse colon. This is stable  from the prior exam. Lungs/Pleura: Small right pleural effusion is noted. Right middle lobe consolidation is noted with volume loss new from the prior CT but stable from recent chest x-ray. Moderate-sized left pleural effusion is noted with left lower lobe consolidation. This has progressed in the interval from the prior exam. Some upper lobe atelectasis is noted as well. Upper Abdomen: Visualized upper abdomen shows no acute abnormality. Large hiatal hernia is again noted. Musculoskeletal: Degenerative changes of the thoracic spine are noted. No rib abnormality is seen. Stable compression deformities involving T6 through T8 as well as T1. IMPRESSION: Right middle and left lower lobe consolidation progressed in the interval from the prior exam. Some left upper lobe atelectasis is noted as well. Bilateral pleural effusions slightly increased from the prior CT. Large hiatal hernia with the stomach and a portion of the transverse colon within the chest Aortic Atherosclerosis (ICD10-I70.0). Electronically Signed   By: Inez Catalina M.D.   On: 07/15/2022 02:01   DG Chest Portable 1 View  Result Date: 07/13/2022 CLINICAL DATA:  Shortness of breath. EXAM: PORTABLE CHEST 1 VIEW COMPARISON:  Chest radiograph and chest CT dated 05/30/2022. FINDINGS: The heart size is normal. Vascular calcifications are seen in the aortic arch. The stomach is again noted in the left thorax. There is a right pleural effusion with associated atelectasis/airspace disease. There is left basilar atelectasis/airspace disease. A left pleural effusion may contribute. There is no pneumothorax. Degenerative changes are seen in the spine. IMPRESSION: 1. Right pleural effusion with associated atelectasis/airspace disease. 2. Left basilar atelectasis/airspace disease and possible left pleural effusion. Aortic Atherosclerosis (ICD10-I70.0). Electronically Signed   By: Zerita Boers M.D.   On: 06/29/2022 18:57      Jonmarc Bodkin T. Draper  If 7PM-7AM, please contact night-coverage [email or web address] 07/15/2022, 12:47 PM

## 2022-07-15 NOTE — Evaluation (Signed)
Clinical/Bedside Swallow Evaluation Patient Details  Name: Kevin Carr MRN: [id number] Date of Birth: 1926-07-06  Today's Date: 07/15/2022 Time: SLP Start Time (ACUTE ONLY): 1016 SLP Stop Time (ACUTE ONLY): 1053 SLP Time Calculation (min) (ACUTE ONLY): 37 min  Past Medical History:  Past Medical History:  Diagnosis Date   Cancer ([place])    prostate   GERD (gastroesophageal reflux disease)    GI bleed    Hiatal hernia    Hypercholesteremia    Osteoporosis    Thyroid disease    Past Surgical History:  Past Surgical History:  Procedure Laterality Date   APPENDECTOMY     TONSILLECTOMY     HPI:  Pt is a 88 yo male admitted from SNF with weakness. Admitted with sepsis suspected to be from aspiration PNA. CT Chest shows RML and LLL consolidation, bilateral pleural effusions, and large hiatal hernia. PMH includes: GERD, prostate ca, hypercholestermia, osteoporosis, thyroid disease    Assessment / Plan / Recommendation  Clinical Impression  Pt and his son deny any h/o dysphagia, but do acknowledge that he has a large hiatal hernia, and that recently since he has been at SNF, he has not been staying upright after meals. Pt was assisted OOB to the chair by SLP and RN, appearing quite fatigued and also short of breath. After he had a moment to catch his breath, he consumed thin liquids via straw with no overt signs to suggest aspiration or oropharyngeal dysphagia. SLP offered a small amount of applesauce, with pt showing more oral holding, slow oral transit, and subjective c/o it going down "slow" compared to how it normally would. Even with no h/o oropharyngeal dysphagia, pt is at some elevated risk for prandial aspiration in the setting of current respiratory status and overall deconditioning. Discussed options with pt and son, including if they want to pursue any additional testing knowing that his hiatal hernia could put him at chronic risk for postprandial aspiration as well. Pt did not stay  awake through this conversation but son said that he would like to hold off on other testing at least for today to see if pt's breathing improves. As discussed with him, will leave pt on current clear liquid diet with assistance for aspiration and esophageal precautions. Can monitor as carefully as possible and SLP will f/u to see if they would like to continue to advance diet as tolerated with known risk vs pursue further testing. SLP Visit Diagnosis: Dysphagia, unspecified (R13.10)    Aspiration Risk  Moderate aspiration risk;Risk for inadequate nutrition/hydration    Diet Recommendation Thin liquid   Liquid Administration via: Cup;Straw Medication Administration: Crushed with puree Supervision: Staff to assist with self feeding;Full supervision/cueing for compensatory strategies Compensations: Minimize environmental distractions;Slow rate;Small sips/bites Postural Changes: Remain upright for at least 30 minutes after po intake;Seated upright at 90 degrees    Other  Recommendations Oral Care Recommendations: Oral care BID    Recommendations for follow up therapy are one component of a multi-disciplinary discharge planning process, led by the attending physician.  Recommendations may be updated based on patient status, additional functional criteria and insurance authorization.  Follow up Recommendations Skilled nursing-short term rehab (<3 hours/day)      Assistance Recommended at Discharge Frequent or constant Supervision/Assistance  Functional Status Assessment Patient has had a recent decline in their functional status and demonstrates the ability to make significant improvements in function in a reasonable and predictable amount of time.  Frequency and Duration min 2x/week  2 weeks  Prognosis Prognosis for Safe Diet Advancement: Good Barriers to Reach Goals: Cognitive deficits      Swallow Study   General HPI: Pt is a 88 yo male admitted from SNF with weakness. Admitted  with sepsis suspected to be from aspiration PNA. CT Chest shows RML and LLL consolidation, bilateral pleural effusions, and large hiatal hernia. PMH includes: GERD, prostate ca, hypercholestermia, osteoporosis, thyroid disease Type of Study: Bedside Swallow Evaluation Previous Swallow Assessment: none in chart Diet Prior to this Study: Thin liquids Temperature Spikes Noted: No Respiratory Status: Nasal cannula History of Recent Intubation: No Behavior/Cognition: Alert;Cooperative;Requires cueing Oral Cavity Assessment: Within Functional Limits Oral Care Completed by SLP: No Oral Cavity - Dentition: Adequate natural dentition Vision: Functional for self-feeding Self-Feeding Abilities: Total assist Patient Positioning: Upright in chair Baseline Vocal Quality: Normal Volitional Cough: Congested Volitional Swallow: Unable to elicit    Oral/Motor/Sensory Function Overall Oral Motor/Sensory Function: Generalized oral weakness   Ice Chips Ice chips: Not tested   Thin Liquid Thin Liquid: Within functional limits Presentation: Straw    Nectar Thick Nectar Thick Liquid: Not tested   Honey Thick Honey Thick Liquid: Not tested   Puree Puree: Impaired Presentation: Spoon Oral Phase Functional Implications: Oral holding;Prolonged oral transit Pharyngeal Phase Impairments: Other (comments) ("slow")   Solid     Solid: Not tested      Osie Bond., M.A. [place] Office [phone]  Secure chat preferred  07/15/2022,12:02 PM

## 2022-07-15 NOTE — Progress Notes (Signed)
OT Cancellation Note  Patient Details Name: Kevin Carr MRN: [id number] DOB: 1926/02/06   Cancelled Treatment:    Reason Eval/Treat Not Completed: Other (comment) Per RN, pt with poor respiratory status at this time, SOB with minimal activity and pending thoracentesis. Will hold OT today and follow up tomorrow.   Layla Maw 07/15/2022, 2:14 PM

## 2022-07-15 NOTE — Progress Notes (Signed)
NEW ADMISSION NOTE New Admission Note:    Arrival Method: ED stretcher Mental Orientation: AAOx3 Telemetry: 5M14 Assessment: Completed Skin: See flowsheet IV: R arm and L arm Pain: 0/10 Tubes: n/a Safety Measures: Safety Fall Prevention Plan has been given, discussed and signed Admission: Completed [facility] Orientation: Patient has been orientated to the room, unit and staff.  Family: son at bedside   Orders have been reviewed and implemented. Will continue to monitor the patient. Call light has been placed within reach and bed alarm has been activated.

## 2022-07-15 NOTE — Progress Notes (Signed)
Current foley removed and replaced with coude foley, per orders. (+) urine return-Cloudy/ hazy,tea colored urine, with blood clots and sediment noted. Patient tolerated foley exchange without complaint of.  Kevin Carr A

## 2022-07-16 ENCOUNTER — Inpatient Hospital Stay (HOSPITAL_COMMUNITY): Payer: Medicare Other

## 2022-07-16 ENCOUNTER — Other Ambulatory Visit (HOSPITAL_COMMUNITY): Payer: No Typology Code available for payment source

## 2022-07-16 DIAGNOSIS — J9601 Acute respiratory failure with hypoxia: Secondary | ICD-10-CM | POA: Diagnosis not present

## 2022-07-16 DIAGNOSIS — R339 Retention of urine, unspecified: Secondary | ICD-10-CM

## 2022-07-16 DIAGNOSIS — J690 Pneumonitis due to inhalation of food and vomit: Secondary | ICD-10-CM | POA: Diagnosis not present

## 2022-07-16 DIAGNOSIS — N1330 Unspecified hydronephrosis: Secondary | ICD-10-CM

## 2022-07-16 DIAGNOSIS — E039 Hypothyroidism, unspecified: Secondary | ICD-10-CM | POA: Diagnosis not present

## 2022-07-16 DIAGNOSIS — R778 Other specified abnormalities of plasma proteins: Secondary | ICD-10-CM

## 2022-07-16 DIAGNOSIS — N179 Acute kidney failure, unspecified: Secondary | ICD-10-CM | POA: Diagnosis not present

## 2022-07-16 LAB — URINE CULTURE: Culture: NO GROWTH

## 2022-07-16 LAB — CBC
HCT: 30.7 % — ABNORMAL LOW (ref 39.0–52.0)
Hemoglobin: 9.5 g/dL — ABNORMAL LOW (ref 13.0–17.0)
MCH: 29.5 pg (ref 26.0–34.0)
MCHC: 30.9 g/dL (ref 30.0–36.0)
MCV: 95.3 fL (ref 80.0–100.0)
Platelets: 303 10*3/uL (ref 150–400)
RBC: 3.22 MIL/uL — ABNORMAL LOW (ref 4.22–5.81)
RDW: 15.4 % (ref 11.5–15.5)
WBC: 11.7 10*3/uL — ABNORMAL HIGH (ref 4.0–10.5)
nRBC: 0 % (ref 0.0–0.2)

## 2022-07-16 LAB — PROCALCITONIN: Procalcitonin: 0.3 ng/mL

## 2022-07-16 LAB — RENAL FUNCTION PANEL
Albumin: 2.2 g/dL — ABNORMAL LOW (ref 3.5–5.0)
Anion gap: 9 (ref 5–15)
BUN: 35 mg/dL — ABNORMAL HIGH (ref 8–23)
CO2: 24 mmol/L (ref 22–32)
Calcium: 8.5 mg/dL — ABNORMAL LOW (ref 8.9–10.3)
Chloride: 105 mmol/L (ref 98–111)
Creatinine, Ser: 1.38 mg/dL — ABNORMAL HIGH (ref 0.61–1.24)
GFR, Estimated: 47 mL/min — ABNORMAL LOW (ref >60)
Glucose, Bld: 94 mg/dL (ref 70–99)
Phosphorus: 2.8 mg/dL (ref 2.5–4.6)
Potassium: 4.1 mmol/L (ref 3.5–5.1)
Sodium: 138 mmol/L (ref 135–145)

## 2022-07-16 LAB — BRAIN NATRIURETIC PEPTIDE: B Natriuretic Peptide: 734 pg/mL — ABNORMAL HIGH (ref 0.0–100.0)

## 2022-07-16 LAB — MAGNESIUM: Magnesium: 2 mg/dL (ref 1.7–2.4)

## 2022-07-16 MED ORDER — LIDOCAINE HCL 1 % IJ SOLN
INTRAMUSCULAR | Status: AC
  Filled 2022-07-16: qty 20

## 2022-07-16 MED ORDER — GUAIFENESIN ER 600 MG PO TB12
600.0000 mg | ORAL_TABLET | Freq: Once | ORAL | Status: AC
  Administered 2022-07-17: 600 mg via ORAL
  Filled 2022-07-16: qty 1

## 2022-07-16 MED ORDER — SODIUM CHLORIDE 0.9 % IV SOLN
2.0000 g | INTRAVENOUS | Status: DC
  Administered 2022-07-16: 2 g via INTRAVENOUS
  Filled 2022-07-16: qty 12.5

## 2022-07-16 MED ORDER — MELATONIN 3 MG PO TABS
3.0000 mg | ORAL_TABLET | Freq: Every evening | ORAL | Status: DC | PRN
  Administered 2022-07-17 – 2022-07-22 (×3): 3 mg via ORAL
  Filled 2022-07-16 (×4): qty 1

## 2022-07-16 MED ORDER — ALBUMIN HUMAN 25 % IV SOLN
25.0000 g | Freq: Every day | INTRAVENOUS | Status: DC
  Administered 2022-07-16 – 2022-07-22 (×7): 25 g via INTRAVENOUS
  Filled 2022-07-16 (×7): qty 100

## 2022-07-16 MED ORDER — FUROSEMIDE 10 MG/ML IJ SOLN
40.0000 mg | Freq: Every day | INTRAMUSCULAR | Status: DC
  Administered 2022-07-16 – 2022-07-22 (×7): 40 mg via INTRAVENOUS
  Filled 2022-07-16 (×7): qty 4

## 2022-07-16 MED ORDER — SODIUM CHLORIDE 0.9 % IV SOLN: 2.0000 g | Freq: Two times a day (BID) | INTRAVENOUS | Status: DC

## 2022-07-16 NOTE — NC FL2 (Signed)
[place] LEVEL OF CARE SCREENING TOOL     IDENTIFICATION  Patient Name: Kevin Carr Birthdate: 10-29-1926 Sex: male Admission Date (Current Location): 07/18/2022  [facility] and [place] Number:  Herbalist and Address:  [facility] [place], [place] [address] [place], [place] [address]      Provider Number: [id number]  Attending Physician Name and Address:  Mercy Riding, MD  Relative Name and Phone Number:  Lukasz, Rogus (Son)   [phone]    Current Level of Care: Hospital Recommended Level of Care: [place] Prior Approval Number:    Date Approved/Denied:   PASRR Number: [id number]  Discharge Plan: SNF    Current Diagnoses: Patient Active Problem List   Diagnosis Date Noted   Goals of care, counseling/discussion 07/15/2022   Elevated troponin 07/15/2022   Aspiration pneumonia ([place]) 07/03/2022   Acute respiratory failure with hypoxia ([place]) 07/05/2022   Wheelchair dependence 07/18/2022   Urinary retention 07/15/2022   AKI (acute kidney injury) ([place]) 05/30/2022   Hypothyroidism 05/30/2022   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   Bilateral hydroureteronephrosis 05/30/2022   Thoracic compression fracture ([place]) 05/30/2022   UTI (urinary tract infection) due to urinary indwelling Foley catheter ([place]) 05/30/2022   Large hiatal hernia 01/30/2021    Orientation RESPIRATION BLADDER Height & Weight     Self, Time, Place  O2 (5L) Continent Weight: 171 lb 4.8 oz (77.7 kg) Height:  '5\' 11"'$  (180.3 cm)  BEHAVIORAL SYMPTOMS/MOOD NEUROLOGICAL BOWEL NUTRITION STATUS      Continent Diet (see d/c summary)  AMBULATORY STATUS COMMUNICATION OF NEEDS Skin   Limited Assist Verbally PU Stage and Appropriate Care (deep tissue sacrum)                       Personal Care Assistance Level of Assistance  Feeding, Dressing, Bathing Bathing Assistance: Limited assistance Feeding assistance: Limited assistance Dressing Assistance: Limited  assistance     Functional Limitations Info  Sight, Hearing, Speech Sight Info: Adequate Hearing Info: Adequate      SPECIAL CARE FACTORS FREQUENCY  PT (By licensed PT), OT (By licensed OT)     PT Frequency: 5x/ week OT Frequency: 5x/ week            Contractures Contractures Info: Not present    Additional Factors Info  Code Status, Allergies Code Status Info: Full Allergies Info: NKA           Current Medications (07/16/2022):  This is the current hospital active medication list Current Facility-Administered Medications  Medication Dose Route Frequency Provider Last Rate Last Admin   acetaminophen (TYLENOL) tablet 650 mg  650 mg Oral Q6H PRN Kristopher Oppenheim, DO       Or   acetaminophen (TYLENOL) suppository 650 mg  650 mg Rectal Q6H PRN Kristopher Oppenheim, DO       albuterol (PROVENTIL) (2.5 MG/3ML) 0.083% nebulizer solution 2.5 mg  2.5 mg Nebulization Q2H PRN Kristopher Oppenheim, DO       ceFEPIme (MAXIPIME) 2 g in sodium chloride 0.9 % 100 mL IVPB  2 g Intravenous Q24H Pierce, Dwayne A, RPH       finasteride (PROSCAR) tablet 5 mg  5 mg Oral Daily Wendee Beavers T, MD   5 mg at 07/16/22 0908   heparin injection 5,000 Units  5,000 Units Subcutaneous Q8H Kristopher Oppenheim, DO   5,000 Units at 07/16/22 0636   levothyroxine (SYNTHROID) tablet 112 mcg  112 mcg Oral Q0600 Kristopher Oppenheim,  DO   112 mcg at 07/16/22 0636   lidocaine (XYLOCAINE) 1 % (with pres) injection            lidocaine (XYLOCAINE) 2 % jelly 1 Application  1 Application Urethral Once Gonfa, Taye T, MD       metroNIDAZOLE (FLAGYL) IVPB 500 mg  500 mg Intravenous Q12H Wendee Beavers T, MD 100 mL/hr at 07/16/22 0958 500 mg at 07/16/22 0958   ondansetron (ZOFRAN) tablet 4 mg  4 mg Oral Q6H PRN Kristopher Oppenheim, DO       Or   ondansetron [facility]) injection 4 mg  4 mg Intravenous Q6H PRN Kristopher Oppenheim, DO         Discharge Medications: Please see discharge summary for a list of discharge medications.  Relevant Imaging Results:  Relevant Lab  Results:   Additional Information SSN: [id number]; 5'11" 171lbs  Maragret Vanacker F Raelene Trew, LCSWA

## 2022-07-16 NOTE — Progress Notes (Signed)
Pt was brought to [place] for diagnostic/therapeutic thoracentesis. Upon US examination, there was no fluid on R and a very small fluid pocket on L that was not large enough to safely access.   Exam was ended and explained to patient.  Patient was in agreement with findings.     Narda Rutherford, AGNP-BC 07/16/2022, 11:53 AM

## 2022-07-16 NOTE — Evaluation (Signed)
Physical Therapy Evaluation Patient Details Name: Kevin Carr MRN: [id number] DOB: 01/16/1926 Today's Date: 07/16/2022  History of Present Illness  88 y.o. male who presented to the ED 07/25/2022 from [facility], chest x-ray reveals bilateral lower lobe pneumonia and sepsis with acute renal failure. Has been wheelchair bound since 8/23 hospitalization.  PMH significant for remote prostate cancer, hypothyroidism, GERD, hiatal hernia with inverted stomach  Clinical Impression  PTA pt at [facility] using w/d for distance mobilization and requiring assist for ADLs. Pt is currently limited in safe mobility by increased O2 demand especially with mobility and decreased strength, balance and endurance. Pt is min Ax2 for transfers and ambulation of 18 feet with RW. Pt on 2L O2 via [place] on entry, SpO2 in high 80s sitting EoB, increased to 3L O2 for ambulation and with sitting SpO2 noted to be 78%O2 via . Increased supplemental O2 to 5L and with approximately 3 min of purse lip breathing able to recover to 90%O2. Recommended continuous pulse ox to RN. PT recommending resumption of PT at SNF at discharge. PT will continue to follow acutely.     Recommendations for follow up therapy are one component of a multi-disciplinary discharge planning process, led by the attending physician.  Recommendations may be updated based on patient status, additional functional criteria and insurance authorization.  Follow Up Recommendations Skilled nursing-short term rehab (<3 hours/day) (return to [facility]) Can patient physically be transported by private vehicle: No    Assistance Recommended at Discharge Frequent or constant Supervision/Assistance  Patient can return home with the following  Two people to help with walking and/or transfers;Two people to help with bathing/dressing/bathroom;Assistance with cooking/housework;Assistance with feeding;Direct supervision/assist for medications management;Direct  supervision/assist for financial management;Assist for transportation;Help with stairs or ramp for entrance    Equipment Recommendations None recommended by PT     Functional Status Assessment Patient has had a recent decline in their functional status and demonstrates the ability to make significant improvements in function in a reasonable and predictable amount of time.     Precautions / Restrictions Precautions Precautions: Fall Precaution Comments: catheter, Mon O2 Restrictions Weight Bearing Restrictions: No      Mobility  Bed Mobility               General bed mobility comments: sitting EoB on entry    Transfers Overall transfer level: Needs assistance Equipment used: Rolling walker (2 wheels) Transfers: Sit to/from Stand Sit to Stand: Min assist, +2 safety/equipment, +2 physical assistance, From elevated surface           General transfer comment: pt prefers to pull up on RW, +2 for holding down RW, min physical A for power up to standing, and steadying.    Ambulation/Gait Ambulation/Gait assistance: Min assist Gait Distance (Feet): 18 Feet Assistive device: Rolling walker (2 wheels) Gait Pattern/deviations: Step-through pattern, Decreased step length - right, Decreased step length - left, Shuffle, Trunk flexed Gait velocity: slowed Gait velocity interpretation: <1.8 ft/sec, indicate of risk for recurrent falls   General Gait Details: light min A for safety with mildly unsteady shuffling gait.        Balance Overall balance assessment: Needs assistance Sitting-balance support: Single extremity supported, Bilateral upper extremity supported, No upper extremity supported, Feet supported Sitting balance-Leahy Scale: Fair     Standing balance support: During functional activity, Reliant on assistive device for balance Standing balance-Leahy Scale: Poor Standing balance comment: reliant on RW for support  Pertinent Vitals/Pain Pain Assessment Pain Assessment: Faces Faces Pain Scale: Hurts little more Pain Location: generalized Pain Descriptors / Indicators: Grimacing, Guarding Pain Intervention(s): Limited activity within patient's tolerance, Monitored during session, Repositioned    Home Living Family/patient expects to be discharged to:: Skilled nursing facility Living Arrangements: Children Available Help at Discharge: Family;Available 24 hours/day Type of Home: [place]             Additional Comments: pt from [facility]    Prior Function Prior Level of Function : Needs assist  Cognitive Assist : ADLs (cognitive)   ADLs (Cognitive): Intermittent cues Physical Assist : ADLs (physical)   ADLs (physical): Toileting;Dressing;Bathing (Pt unable to confirm this. Based on recent Eval last month and pt being at SNF since that time.) Mobility Comments: used RW or WC in facility ADLs Comments: needs assist for ADLs        Extremity/Trunk Assessment   Upper Extremity Assessment Upper Extremity Assessment: Generalized weakness;RUE deficits/detail;LUE deficits/detail RUE Deficits / Details: Edematous UE, weeping. RUE Coordination: decreased fine motor LUE Deficits / Details: Edematous UE, weeping. LUE Coordination: decreased fine motor    Lower Extremity Assessment Lower Extremity Assessment: RLE deficits/detail;LLE deficits/detail RLE Deficits / Details: ROM WFL, strength grossly 3+/5, increased edema R>L and rubor below knee LLE Deficits / Details: ROM WFL, strength grossly 3+/5, increased edema R>L and rubor below knee    Cervical / Trunk Assessment Cervical / Trunk Assessment: Kyphotic (limits inhalation in upright)  Communication   Communication: No difficulties  Cognition Arousal/Alertness: Awake/alert Behavior During Therapy: Flat affect Overall Cognitive Status: Within Functional Limits for tasks assessed                                  General Comments: oriented to self and general location, and situation, follows commands with increased time, HoH        General Comments  Pt with weeping edema in bilateral UE,     Exercises     Assessment/Plan    PT Assessment Patient needs continued PT services  PT Problem List Decreased strength;Decreased range of motion;Decreased activity tolerance;Decreased balance;Decreased mobility;Decreased coordination;Decreased cognition;Decreased knowledge of use of DME;Decreased safety awareness;Cardiopulmonary status limiting activity;Decreased skin integrity       PT Treatment Interventions DME instruction;Gait training;Functional mobility training;Therapeutic activities;Therapeutic exercise;Balance training;Cognitive remediation;Patient/family education    PT Goals (Current goals can be found in the Care Plan section)  Acute Rehab PT Goals Patient Stated Goal: breathe better PT Goal Formulation: With patient Time For Goal Achievement: 07/30/22 Potential to Achieve Goals: Fair    Frequency Min 2X/week     Co-evaluation   Reason for Co-Treatment: For patient/therapist safety;To address functional/ADL transfers   OT goals addressed during session: ADL's and self-care       AM-PAC PT "6 Clicks" Mobility  Outcome Measure Help needed turning from your back to your side while in a flat bed without using bedrails?: A Little Help needed moving from lying on your back to sitting on the side of a flat bed without using bedrails?: A Little Help needed moving to and from a bed to a chair (including a wheelchair)?: A Little Help needed standing up from a chair using your arms (e.g., wheelchair or bedside chair)?: A Little Help needed to walk in hospital room?: A Little Help needed climbing 3-5 steps with a railing? : Total 6 Click Score: 16    End of Session Equipment  Utilized During Treatment: Gait belt Activity Tolerance: Treatment limited secondary to medical  complications (Comment) (O2 desaturation) Patient left: in chair;with call bell/phone within reach;with chair alarm set Nurse Communication: Mobility status;Other (comment) (recommend continuous pulse ox due to fluctuations in SpO2 and pt tendency to take Ruth out to blow nose and inability to correctly replace in nares) PT Visit Diagnosis: Other abnormalities of gait and mobility (R26.89);Muscle weakness (generalized) (M62.81);Difficulty in walking, not elsewhere classified (R26.2)    Time: 1025-8527 PT Time Calculation (min) (ACUTE ONLY): 19 min   Charges:   PT Evaluation $PT Eval Moderate Complexity: 1 Mod          Reynol Arnone B. Migdalia Dk PT, DPT Acute Rehabilitation Services Please use secure chat or  Call Office [phone]   [place] 07/16/2022, 10:13 AM

## 2022-07-16 NOTE — Progress Notes (Signed)
Speech Language Pathology Treatment: Dysphagia  Patient Details Name: Kevin Carr MRN: [id number] DOB: 1926-09-11 Today's Date: 07/16/2022 Time: 5400-8676 SLP Time Calculation (min) (ACUTE ONLY): 9 min  Assessment / Plan / Recommendation Clinical Impression  Pt was seen again this morning, sitting up in his chair with breakfast tray in front of him, seemingly with minimal intake. He consumed liquids only, interested in a few sips of juice but declining jello or anything more solid - even purees. He says that solids are harder to swallow because they want to "come back up." When asked if liquids seem to go down better he says "I don't know" and he still does not want very much to drink. Baseline cough was noted before and after thin liquids were offered, but once he was able to cough up a small amount of phlegm, there was no further coughing noted during PO trials. Son not present yet today but do not think pt would take in much to participate in MBS today and respiratory status appears to be fairly similar to previous date. Recommend maintaining clear liquid diet with frequent oral care and careful adherence to aspiration and esophageal precautions. Will continue to follow.    HPI HPI: Pt is a 88 yo male admitted from SNF with weakness. Admitted with sepsis suspected to be from aspiration PNA. CT Chest shows RML and LLL consolidation, bilateral pleural effusions, and large hiatal hernia. PMH includes: GERD, prostate ca, hypercholestermia, osteoporosis, thyroid disease      SLP Plan  Continue with current plan of care      Recommendations for follow up therapy are one component of a multi-disciplinary discharge planning process, led by the attending physician.  Recommendations may be updated based on patient status, additional functional criteria and insurance authorization.    Recommendations  Diet recommendations: Thin liquid Liquids provided via: Straw;Cup Medication Administration:  Crushed with puree Supervision: Staff to assist with self feeding;Full supervision/cueing for compensatory strategies Compensations: Minimize environmental distractions;Slow rate;Small sips/bites Postural Changes and/or Swallow Maneuvers: Seated upright 90 degrees;Upright 30-60 min after meal                Oral Care Recommendations: Oral care QID;Staff/trained caregiver to provide oral care Follow Up Recommendations: Skilled nursing-short term rehab (<3 hours/day) Assistance recommended at discharge: Frequent or constant Supervision/Assistance SLP Visit Diagnosis: Dysphagia, unspecified (R13.10) Plan: Continue with current plan of care           Osie Bond., M.A. [place] Office [phone]  Secure chat preferred   07/16/2022, 11:05 AM

## 2022-07-16 NOTE — Evaluation (Signed)
Occupational Therapy Evaluation Patient Details Name: Kevin Carr MRN: [id number] DOB: 04/17/1926 Today's Date: 07/16/2022   History of Present Illness 88 y.o. male who presented to the ED 07/23/2022 from [facility], chest x-ray reveals bilateral lower lobe pneumonia and sepsis with acute renal failure. Has been wheelchair bound since 8/23 hospitalization.  PMH significant for remote prostate cancer, hypothyroidism, GERD, hiatal hernia with inverted stomach   Clinical Impression   Patient is currently requiring assistance with ADLs including Total assist with bed level Lower body ADLs, up to moderate assist with seated Upper body ADLs,  as well as Min guard assist with bed mobility and Min assist with functional transfers to [facility].  Current level of function is below patient's typical baseline, described as living independently, ambulating and driving prior to July 2023.   During this evaluation, patient was limited by generalized weakness, impaired activity tolerance with almost continuous wet sounding coughing once mobilizing, and impaired cognition with poor memory and answering "I don't know" to majority of questions, all of which has the potential to impact patient's safety and independence during functional mobility, as well as performance for ADLs.  Patient lived alone prior to his recent admission in August and has been at [facility] for skilled rehab since then.  Patient demonstrates fair rehab potential, and should benefit from continued skilled occupational therapy services while in acute care to maximize safety, independence and quality of life at home.  Continued occupational therapy services in a SNF setting prior to return home is recommended.  ?     Recommendations for follow up therapy are one component of a multi-disciplinary discharge planning process, led by the attending physician.  Recommendations may be updated based on patient status, additional functional criteria and  insurance authorization.   Follow Up Recommendations  Skilled nursing-short term rehab (<3 hours/day)    Assistance Recommended at Discharge Frequent or constant Supervision/Assistance  Patient can return home with the following A lot of help with walking and/or transfers;A lot of help with bathing/dressing/bathroom;Assistance with cooking/housework;Help with stairs or ramp for entrance;Assist for transportation    Functional Status Assessment  Patient has had a recent decline in their functional status and demonstrates the ability to make significant improvements in function in a reasonable and predictable amount of time.  Equipment Recommendations  None recommended by OT    Recommendations for Other Services       Precautions / Restrictions Precautions Precautions: Fall Precaution Comments: catheter, Mon O2 Restrictions Weight Bearing Restrictions: No      Mobility Bed Mobility   Bed Mobility: Supine to Sit     Supine to sit: Min guard          Transfers                          Balance Overall balance assessment: Needs assistance Sitting-balance support: No upper extremity supported, Feet supported Sitting balance-Leahy Scale: Fair Sitting balance - Comments: performed grooming tasks seated on EOB. Posterior lean during UE ROM assessment. Postural control: Posterior lean Standing balance support: During functional activity, Reliant on assistive device for balance Standing balance-Leahy Scale: Poor Standing balance comment: reliant on RW for support                           ADL either performed or assessed with clinical judgement   ADL Overall ADL's : Needs assistance/impaired Eating/Feeding: Bed level;Minimal assistance;Cueing for sequencing  Grooming: Dance movement psychotherapist;Set up;Sitting;Minimal assistance Grooming Details (indicate cue type and reason): Required Min As for thoroughness to wipe food from mouth after breakfast. Upper Body  Bathing: Moderate assistance;Bed level   Lower Body Bathing: Maximal assistance;Bed level   Upper Body Dressing : Moderate assistance;Sitting   Lower Body Dressing: Total assistance;Bed level   Toilet Transfer: Minimal assistance;Stand-pivot;Rolling walker (2 wheels);Cueing for safety;Cueing for sequencing Toilet Transfer Details (indicate cue type and reason): Pt stood from Elevated EOB to RW, keeping his hands on RW with cues for use of handles and assist to keep RW secured to floor with Min As to power up.  Pt ambulated with chair follow on 5L O2 and Min As. Descent to recliner with Min guard assist and cues to simulate BSC. Toileting- Clothing Manipulation and Hygiene: Total assistance Toileting - Clothing Manipulation Details (indicate cue type and reason): catheter     Functional mobility during ADLs: Minimal assistance;Rolling walker (2 wheels);+2 for safety/equipment       Vision Patient Visual Report: No change from baseline Vision Assessment?: No apparent visual deficits     Perception     Praxis      Pertinent Vitals/Pain Pain Assessment Pain Assessment: Faces Faces Pain Scale: Hurts little more Pain Location: generalized.  Pt denied pain when asked Pain Descriptors / Indicators: Grimacing, Guarding Pain Intervention(s): Limited activity within patient's tolerance, Monitored during session, Repositioned     Hand Dominance     Extremity/Trunk Assessment Upper Extremity Assessment Upper Extremity Assessment: Generalized weakness;RUE deficits/detail;LUE deficits/detail RUE Deficits / Details: Edematous UE, weeping. RUE Coordination: decreased fine motor LUE Deficits / Details: Edematous UE, weeping. LUE Coordination: decreased fine motor   Lower Extremity Assessment Lower Extremity Assessment: RLE deficits/detail;LLE deficits/detail RLE Deficits / Details: ROM WFL, strength grossly 3+/5, increased edema R>L and rubor below knee LLE Deficits / Details: ROM WFL,  strength grossly 3+/5, increased edema R>L and rubor below knee   Cervical / Trunk Assessment Cervical / Trunk Assessment: Kyphotic (limits inhalation in upright)   Communication Communication Communication: No difficulties   Cognition Arousal/Alertness: Awake/alert (In and out of alertness. More alert once EOB) Behavior During Therapy: Flat affect Overall Cognitive Status: Within Functional Limits for tasks assessed                                 General Comments: oriented to self and general location, and situation, follows commands with increased time, HoH.  Mostly answers "I don't know" to yes/no questions, questions about home and recentlevel of function.     General Comments       Exercises     Shoulder Instructions      Home Living Family/patient expects to be discharged to:: Skilled nursing facility Living Arrangements: Children Available Help at Discharge: Family;Available 24 hours/day Type of Home: Blanket                           Additional Comments: pt from [facility]      Prior Functioning/Environment Prior Level of Function : Needs assist  Cognitive Assist : ADLs (cognitive)   ADLs (Cognitive): Intermittent cues Physical Assist : ADLs (physical)   ADLs (physical): Toileting;Dressing;Bathing (Pt unable to confirm this. Based on recent Eval last month and pt being at SNF since that time.) Mobility Comments: used RW or WC in facility ADLs Comments: needs assist for ADLs  OT Problem List: Decreased strength;Decreased activity tolerance;Impaired balance (sitting and/or standing);Decreased knowledge of use of DME or AE;Cardiopulmonary status limiting activity;Decreased cognition      OT Treatment/Interventions: Self-care/ADL training;Therapeutic exercise;DME and/or AE instruction;Therapeutic activities;Balance training;Patient/family education;Cognitive remediation/compensation    OT Goals(Current goals  can be found in the care plan section) Acute Rehab OT Goals OT Goal Formulation: Patient unable to participate in goal setting Time For Goal Achievement: 07/30/22 Potential to Achieve Goals: Fair ADL Goals Pt Will Perform Upper Body Bathing: sitting;with supervision;with set-up Pt Will Transfer to Toilet: with min guard assist;ambulating;grab bars Pt/caregiver will Perform Home Exercise Program: Increased strength;Both right and left upper extremity;With Supervision (Light BUE strengthening such as towel on table top, tolerating at least 2 sets of 10 with vitals stable.) Additional ADL Goal #1: Pt will tolerate at least 10 min functional activity at EOB or edge of chair without LOB and with vitals stable as evidence of improved sitting balance and activity tolerance.  OT Frequency: Min 2X/week    Co-evaluation PT/OT/SLP Co-Evaluation/Treatment: Yes Reason for Co-Treatment: For patient/therapist safety;To address functional/ADL transfers   OT goals addressed during session: ADL's and self-care      AM-PAC OT "6 Clicks" Daily Activity     Outcome Measure Help from another person eating meals?: A Little Help from another person taking care of personal grooming?: A Lot Help from another person toileting, which includes using toliet, bedpan, or urinal?: Total Help from another person bathing (including washing, rinsing, drying)?: A Lot Help from another person to put on and taking off regular upper body clothing?: A Lot Help from another person to put on and taking off regular lower body clothing?: Total 6 Click Score: 11   End of Session Equipment Utilized During Treatment: Gait belt;Rolling walker (2 wheels) Nurse Communication: Other (comment) (Vitals and recommendation of continuous O2 monitoring)  Activity Tolerance: Patient limited by fatigue;Other (comment) (Limited by almost continuous cough once EOB and mobilizing) Patient left: in chair;with call bell/phone within reach;with  chair alarm set  OT Visit Diagnosis: Muscle weakness (generalized) (M62.81);Unsteadiness on feet (R26.81)                Time: 1572-6203 OT Time Calculation (min): 33 min Charges:  OT General Charges $OT Visit: 1 Visit OT Evaluation $OT Eval Moderate Complexity: 1 Mod  Dominique Ressel, OT Acute Rehab Services Office: [phone] 07/16/2022  Julien Girt 07/16/2022, 10:19 AM

## 2022-07-16 NOTE — TOC Progression Note (Signed)
Transition of Care [facility]) - Initial/Assessment Note    Patient Details  Name: Kevin Carr MRN: [id number] Date of Birth: [birth year]  [facility] CM/SW Contact:    Milinda Antis, [place] Phone Number: 07/16/2022, 12:18 PM  Clinical Narrative:                 CSW informed that patient was ST care at the facility and that the facility is willing to accept the patient back when medically ready if insurance approves.        Patient Goals and CMS Choice        Expected Discharge Plan and Services                                                Prior Living Arrangements/Services                       Activities of Daily Living Home Assistive Devices/Equipment: Oxygen, Hoyer Lift (pt. from SNF) ADL Screening (condition at time of admission) Patient's cognitive ability adequate to safely complete daily activities?: No Is the patient deaf or have difficulty hearing?: No Does the patient have difficulty seeing, even when wearing glasses/contacts?: Yes Does the patient have difficulty concentrating, remembering, or making decisions?: Yes Patient able to express need for assistance with ADLs?: Yes Does the patient have difficulty dressing or bathing?: Yes Independently performs ADLs?: No Communication: Dependent Is this a change from baseline?: Pre-admission baseline Dressing (OT): Dependent Is this a change from baseline?: Pre-admission baseline Grooming: Dependent Is this a change from baseline?: Pre-admission baseline Feeding: Needs assistance Is this a change from baseline?: Pre-admission baseline Bathing: Dependent Is this a change from baseline?: Pre-admission baseline Toileting: Dependent Is this a change from baseline?: Pre-admission baseline In/Out Bed: Dependent Is this a change from baseline?: Pre-admission baseline Walks in Home: Dependent Is this a change from baseline?: Pre-admission baseline Does the patient have difficulty  walking or climbing stairs?: Yes Weakness of Legs: Both Weakness of Arms/Hands: Both  Permission Sought/Granted                  Emotional Assessment              Admission diagnosis:  Aspiration pneumonia ([place]) [J69.0] AKI (acute kidney injury) ([place]) [N17.9] Aspiration pneumonia of both lungs, unspecified aspiration pneumonia type, unspecified part of lung ([place]) [J69.0] Patient Active Problem List   Diagnosis Date Noted   Goals of care, counseling/discussion 07/15/2022   Elevated troponin 07/15/2022   Aspiration pneumonia ([place]) 07/27/2022   Acute respiratory failure with hypoxia ([place]) 07/22/2022   Wheelchair dependence 07/21/2022   Urinary retention 07/10/2022   AKI (acute kidney injury) ([place]) 05/30/2022   Hypothyroidism 05/30/2022   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

## 2022-07-16 NOTE — Progress Notes (Signed)
PROGRESS NOTE  Kevin Carr FHQ:[id number] DOB: [birth year]   PCP: Lujean Amel, MD  Patient is from: [facility]  DOA: 07/15/2022 LOS: 2  Chief complaints Chief Complaint  Patient presents with   Weakness     Brief Narrative / Interim history: 88 year old M with PMH of large hiatal hernia, urinary retention/bilateral hydroureteronephrosis with chronic Foley catheter and prostate cancer presenting with abnormal labs including elevated creatinine and chest x-ray concerning for pulmonary edema, and admitted for aspiration pneumonia and AKI.  Family reports overall decline over the last 2 months, mainly over the last 1 week.   In ED, slightly hypotensive to 90/55.  Saturating at 100% on 4 L. Cr 2.92 (baseline 1.0).  BUN 50.  WBC 17.3 with left shift.  Hgb 8.5 (baseline 10-11).  Troponin 481.  BNP 1050.  Lactic acid 0.9.  EKG NSR with LVH but no acute ischemic finding.  UA with large LE and large Hgb with 21-50 RBC per hpf but obtained from chronic Foley.  Foley catheter exchanged in ED.  CXR showed large hiatal hernia, right pleural effusion with associated atelectasis/airspace disease and left basilar atelectasis/airspace disease.  Cultures obtained.  CT chest ordered.  Patient was started on IV cefepime and admitted.  CT chest with progressive RML and LLL consolidation, bilateral pleural effusion, large hiatal hernia with the stomach and portion of transverse colon within the chest.  IR consulted for thoracocentesis but did not appreciate enough fluid for intervention.  Renal ultrasound with moderate left hydronephrosis and 13 mm nonobstructing calculus in left lower pole.  TTE pending.  AKI resolving.  Remains on IV cefepime and Flagyl for suppression pneumonia.  Therapy recommended SNF.   Subjective: Seen and examined earlier this morning.  No major events overnight of this morning.  Continues to report shortness of breath and cough sometimes.  Cough is productive at times.   Denies chest pain or abdominal pain.  AKI, leukocytosis and Pro-Cal improving.  Objective: Vitals:   07/15/22 1646 07/15/22 2149 07/16/22 0540 07/16/22 0847  BP: (!) 105/57 (!) 112/59 103/67 (!) 139/58  Pulse: 80 80 73 75  Resp: _0 Temp: 97.8 F (36.6 C) 97.9 F (36.6 C) 98 F (36.7 C) (!) 97.5 F (36.4 C)  TempSrc:    Oral  SpO2: 90% 99% (!) 79% 95%  Weight:  77.7 kg    Height:        Examination:   GENERAL: Seen laying on the edge of the bed hunched over with therapy at bedside HEENT: MMM.  Vision and hearing grossly intact.  NECK: Supple.  No apparent JVD.  RESP:  No IWOB.  Rhonchorous. CVS:  RRR. Heart sounds normal.  ABD/GI/GU: BS+. Abd soft, NTND.  MSK/EXT:  Moves extremities.  1+ edema in all extremities.  LLE > RLE. SKIN: no apparent skin lesion or wound NEURO: Awake and alert. Oriented appropriately.  No apparent focal neuro deficit. PSYCH: Calm. Normal affect.      Procedures:  None  Microbiology summarized: MRSA PCR screen nonreactive. Blood cultures NGTD. Urine culture pending  Assessment and plan: Principal Problem:   Aspiration pneumonia ([place]) Active Problems:   UTI (urinary tract infection) due to urinary indwelling Foley catheter ([facility])   Acute respiratory failure with hypoxia ([facility])   AKI (acute kidney injury) ([place])   Hypothyroidism   Large hiatal hernia   Wheelchair dependence   Urinary retention   Goals of care, counseling/discussion   Elevated troponin   Acute respiratory  failure with hypoxia likely due to aspiration pneumonia, pleural effusion, atelectasis and possible acute unknown type CHF.  Low suspicion for PE.  CXR with significant right pleural effusion and RLL opacity. CT chest with progressive RML and LLL consolidation, bilateral pleural effusion, large hiatal hernia with the stomach and portion of transverse colon within the chest. Given patient's known large hiatal hernia, significant concern for aspiration pneumonia.   Pro-Cal elevated but improving.  Pleural effusion, elevated BNP and troponin also raises concern for acute CHF.  Low suspicion for PE. -Continue IV cefepime and Flagyl -Wean oxygen as able, incentive telemetry, OOB/PT/OT -IR consulted for thoracocentesis but not enough fluid by bedside ultrasound -Follow echocardiogram -Trend leukocytosis and procalcitonin-improving -Aspiration precaution and SLP eval -Patient has not met sepsis criteria yet.  Sepsis ruled out. -Trial of IV Lasix with albumin today  Right pleural effusion-unclear if this is CHF related or parapneumonic. -IR consulted but did not appreciate enough fluid by bedside US. -Follow echocardiogram -Trial of IV Lasix with albumin  Catheter associated UTI and patient with urinary retention/hydronephrosis-UA concerning but obtained from chronic Foley.  He has significant leukocytosis with left shift could also be from pneumonia.  Regarding the change in NAD. -Antibiotics as above  AKI/azotemia: Likely prerenal from poor p.o. intake.  Seems he is also on diuretics and ibuprofen.  Renal US with moderate hydronephrosis.  Improving. Recent Labs    05/30/22 1353 05/31/22 0437 06/01/22 4098 06/02/22 0336 06/03/22 0854 06/04/22 0759 06/05/22 0617 07/05/2022 1813 07/15/22 1531 07/16/22 1044  BUN 170* 125* 76* 48* 30* 26* 24* 50* 44* 35*  CREATININE 4.06* 2.44* 1.43* 1.16 1.02 1.35* 1.04 2.92* 1.88* 1.38*  -Closely monitor urine output -Monitor renal function  BPH/chronic urinary retention with chronic Foley. Moderate left hydronephrosis: Noted on renal US.  He has 13 mm nonobstructing calculus in left lower pole.  -Repeat renal US if AKI worsens.  Otherwise outpatient follow-up. -Continue home Proscar and Foley catheter.  Normocytic anemia: H&H relatively stable. Recent Labs    05/30/22 1353 05/31/22 0437 06/01/22 0812 06/02/22 0336 06/03/22 0854 06/04/22 0759 06/05/22 0617 07/16/2022 1813 07/15/22 1132 07/16/22 1044   HGB 12.3* 11.8* 10.6* 10.4* 10.6* 11.9* 10.5* 8.5* 8.9* 9.5*  -Continue monitoring  BLE edema, left > right. -Check venous Doppler to exclude DVT  Elevated troponin: Patient without chest pain.  EKG without acute finding.  Suspect demand ischemia from hypoxia and infection as well as delayed clearance from AKI.  No significant delta either. -Recheck troponin -Follow echocardiogram  Large hiatal hernia:  -Aspiration precaution and SLP eval  Goal of care counseling: Remains full code.  See goals of care discussion on 9/17.  Body mass index is 23.89 kg/m.    DVT prophylaxis:  heparin injection 5,000 Units Start: 07/15/22 0900 SCDs Start: 07/15/22 0815  Code Status: Full code Family Communication: None at bedside Level of care: Telemetry Medical Status is: Inpatient Remains inpatient appropriate because: Aspiration pneumonia and catheter associated UTI   Final disposition: TBD Consultants:  Dimensional radiology Palliative medicine  Sch Meds:  Scheduled Meds:  finasteride  5 mg Oral Daily   furosemide  40 mg Intravenous Daily   heparin  5,000 Units Subcutaneous Q8H   levothyroxine  112 mcg Oral Q0600   lidocaine       lidocaine  1 Application Urethral Once   Continuous Infusions:  albumin human     ceFEPime (MAXIPIME) IV     metronidazole 500 mg (07/16/22 0958)   PRN Meds:.acetaminophen **OR** acetaminophen,  albuterol, lidocaine, ondansetron **OR** ondansetron (ZOFRAN) IV  Antimicrobials: Anti-infectives (From admission, onward)    Start     Dose/Rate Route Frequency Ordered Stop   07/16/22 2200  ceFEPIme (MAXIPIME) 2 g in sodium chloride 0.9 % 100 mL IVPB        2 g 200 mL/hr over 30 Minutes Intravenous Every 24 hours 07/16/22 0921     07/16/22 1000  ceFEPIme (MAXIPIME) 2 g in sodium chloride 0.9 % 100 mL IVPB  Status:  Discontinued        2 g 200 mL/hr over 30 Minutes Intravenous Every 12 hours 07/16/22 0920 07/16/22 0921   07/15/22 2000  ceFEPIme  (MAXIPIME) 2 g in sodium chloride 0.9 % 100 mL IVPB  Status:  Discontinued        2 g 200 mL/hr over 30 Minutes Intravenous Every 24 hours 07/23/2022 1922 07/16/22 0920   07/15/22 0915  metroNIDAZOLE (FLAGYL) IVPB 500 mg        500 mg 100 mL/hr over 60 Minutes Intravenous Every 12 hours 07/15/22 0815 07/20/22 0914   07/26/2022 1930  vancomycin (VANCOREADY) IVPB 1500 mg/300 mL        1,500 mg 150 mL/hr over 120 Minutes Intravenous  Once 07/23/2022 1920 06/29/2022 2356   07/15/2022 1922  vancomycin variable dose per unstable renal function (pharmacist dosing)  Status:  Discontinued         Does not apply See admin instructions 07/28/2022 1922 07/15/22 1419   07/16/2022 1915  ceFEPIme (MAXIPIME) 2 g in sodium chloride 0.9 % 100 mL IVPB        2 g 200 mL/hr over 30 Minutes Intravenous  Once 07/15/2022 1913 07/07/2022 2354        I have personally reviewed the following labs and images: CBC: Recent Labs  Lab 07/26/2022 1813 07/15/22 1132 07/16/22 1044  WBC 17.3* 14.2* 11.7*  NEUTROABS 15.8* 12.6*  --   HGB 8.5* 8.9* 9.5*  HCT 27.0* 27.8* 30.7*  MCV 93.8 93.9 95.3  PLT 249 223 303   BMP &GFR Recent Labs  Lab 07/01/2022 1813 07/15/22 1531 07/16/22 1044  NA 136 136 138  K 5.0 4.4 4.1  CL 100 103 105  CO2 22 21* 24  GLUCOSE 112* 108* 94  BUN 50* 44* 35*  CREATININE 2.92* 1.88* 1.38*  CALCIUM 8.4* 8.4* 8.5*  MG  --  2.0 2.0  PHOS  --   --  2.8   Estimated Creatinine Clearance: 34.9 mL/min (A) (by C-G formula based on SCr of 1.38 mg/dL (H)). Liver & Pancreas: Recent Labs  Lab 07/04/2022 1813 07/15/22 1531 07/16/22 1044  AST 18 20  --   ALT 12 9  --   ALKPHOS 101 100  --   BILITOT 0.6 0.3  --   PROT 5.5* 5.5*  --   ALBUMIN 2.1* 2.1* 2.2*   Recent Labs  Lab 07/09/2022 1813  LIPASE 21   No results for input(s): "AMMONIA" in the last 168 hours. Diabetic: No results for input(s): "HGBA1C" in the last 72 hours. No results for input(s): "GLUCAP" in the last 168 hours. Cardiac  Enzymes: No results for input(s): "CKTOTAL", "CKMB", "CKMBINDEX", "TROPONINI" in the last 168 hours. No results for input(s): "PROBNP" in the last 8760 hours. Coagulation Profile: No results for input(s): "INR", "PROTIME" in the last 168 hours. Thyroid Function Tests: No results for input(s): "TSH", "T4TOTAL", "FREET4", "T3FREE", "THYROIDAB" in the last 72 hours. Lipid Profile: No results for input(s): "CHOL", "HDL", "LDLCALC", "TRIG", "  CHOLHDL", "LDLDIRECT" in the last 72 hours. Anemia Panel: No results for input(s): "VITAMINB12", "FOLATE", "FERRITIN", "TIBC", "IRON", "RETICCTPCT" in the last 72 hours. Urine analysis:    Component Value Date/Time   COLORURINE YELLOW 07/28/2022 1825   APPEARANCEUR HAZY (A) 07/25/2022 1825   LABSPEC 1.012 07/04/2022 1825   PHURINE 5.0 07/22/2022 1825   GLUCOSEU NEGATIVE 07/28/2022 1825   HGBUR LARGE (A) 07/02/2022 1825   BILIRUBINUR NEGATIVE 07/15/2022 1825   KETONESUR NEGATIVE 07/26/2022 1825   PROTEINUR 100 (A) 07/07/2022 1825   UROBILINOGEN 1.0 10/27/2012 1111   NITRITE NEGATIVE 07/17/2022 1825   LEUKOCYTESUR LARGE (A) 07/03/2022 1825   Sepsis Labs: Invalid input(s): "PROCALCITONIN", "LACTICIDVEN"  Microbiology: Recent Results (from the past 240 hour(s))  Blood culture (routine x 2)     Status: None (Preliminary result)   Collection Time: 07/15/2022  6:14 PM   Specimen: BLOOD RIGHT HAND  Result Value Ref Range Status   Specimen Description BLOOD RIGHT HAND  Final   Special Requests   Final    BOTTLES DRAWN AEROBIC AND ANAEROBIC Blood Culture adequate volume   Culture   Final    NO GROWTH 2 DAYS Performed at [facility], [place] [address] [place], [place] [address]    Report Status PENDING  Incomplete  Urine Culture     Status: None   Collection Time: 07/06/2022  6:25 PM   Specimen: Urine, Clean Catch  Result Value Ref Range Status   Specimen Description URINE, CLEAN CATCH  Final   Special Requests NONE  Final   Culture   Final     NO GROWTH Performed at [facility], [place] [address], [place], [place] [address]    Report Status 07/16/2022 FINAL  Final  MRSA Next Gen by PCR, Nasal     Status: None   Collection Time: 07/07/2022 11:02 PM   Specimen: Nasal Mucosa; Nasal Swab  Result Value Ref Range Status   MRSA by PCR Next Gen NOT DETECTED NOT DETECTED Final    Comment: (NOTE) The GeneXpert MRSA Assay (FDA approved for NASAL specimens only), is one component of a comprehensive MRSA colonization surveillance program. It is not intended to diagnose MRSA infection nor to guide or monitor treatment for MRSA infections. Test performance is not FDA approved in patients less than 24 years old. Performed at [facility], [place] [address], [place], [place] [address]   Blood culture (routine x 2)     Status: None (Preliminary result)   Collection Time: 07/01/2022 11:19 PM   Specimen: BLOOD  Result Value Ref Range Status   Specimen Description BLOOD LEFT ANTECUBITAL  Final   Special Requests   Final    BOTTLES DRAWN AEROBIC AND ANAEROBIC Blood Culture adequate volume   Culture   Final    NO GROWTH 1 DAY Performed at [facility], [place] [address] [place], [place] [address]    Report Status PENDING  Incomplete    Radiology Studies: IR US CHEST  Result Date: 07/16/2022 CLINICAL DATA:  Concern for pleural effusion EXAM: CHEST ULTRASOUND COMPARISON:  CT chest previous day FINDINGS: Focused sonographic exam of the chest was performed for fluid assessment. Images demonstrate no appreciable pleural fluid. No thoracentesis performed. IMPRESSION: No appreciable pleural effusion bilaterally. No intervention performed. Electronically Signed   By: Albin Felling M.D.   On: 07/16/2022 13:56      Juanell Saffo T. Obetz  If 7PM-7AM, please contact night-coverage [email or web address] 07/16/2022, 2:42 PM

## 2022-07-17 ENCOUNTER — Encounter (HOSPITAL_COMMUNITY): Payer: No Typology Code available for payment source

## 2022-07-17 ENCOUNTER — Inpatient Hospital Stay (HOSPITAL_COMMUNITY): Payer: Medicare Other

## 2022-07-17 ENCOUNTER — Other Ambulatory Visit (HOSPITAL_COMMUNITY): Payer: No Typology Code available for payment source

## 2022-07-17 DIAGNOSIS — R8281 Pyuria: Secondary | ICD-10-CM

## 2022-07-17 DIAGNOSIS — Z7189 Other specified counseling: Secondary | ICD-10-CM | POA: Diagnosis not present

## 2022-07-17 DIAGNOSIS — J9601 Acute respiratory failure with hypoxia: Secondary | ICD-10-CM | POA: Diagnosis not present

## 2022-07-17 DIAGNOSIS — N179 Acute kidney failure, unspecified: Secondary | ICD-10-CM | POA: Diagnosis not present

## 2022-07-17 DIAGNOSIS — R310 Gross hematuria: Secondary | ICD-10-CM

## 2022-07-17 DIAGNOSIS — J690 Pneumonitis due to inhalation of food and vomit: Secondary | ICD-10-CM | POA: Diagnosis not present

## 2022-07-17 DIAGNOSIS — Z515 Encounter for palliative care: Secondary | ICD-10-CM

## 2022-07-17 DIAGNOSIS — E039 Hypothyroidism, unspecified: Secondary | ICD-10-CM | POA: Diagnosis not present

## 2022-07-17 LAB — RENAL FUNCTION PANEL
Albumin: 2.5 g/dL — ABNORMAL LOW (ref 3.5–5.0)
Anion gap: 8 (ref 5–15)
BUN: 36 mg/dL — ABNORMAL HIGH (ref 8–23)
CO2: 26 mmol/L (ref 22–32)
Calcium: 8.5 mg/dL — ABNORMAL LOW (ref 8.9–10.3)
Chloride: 103 mmol/L (ref 98–111)
Creatinine, Ser: 1.47 mg/dL — ABNORMAL HIGH (ref 0.61–1.24)
GFR, Estimated: 44 mL/min — ABNORMAL LOW (ref >60)
Glucose, Bld: 98 mg/dL (ref 70–99)
Phosphorus: 2.4 mg/dL — ABNORMAL LOW (ref 2.5–4.6)
Potassium: 3.7 mmol/L (ref 3.5–5.1)
Sodium: 137 mmol/L (ref 135–145)

## 2022-07-17 LAB — BRAIN NATRIURETIC PEPTIDE: B Natriuretic Peptide: 866.3 pg/mL — ABNORMAL HIGH (ref 0.0–100.0)

## 2022-07-17 LAB — CBC
HCT: 27.7 % — ABNORMAL LOW (ref 39.0–52.0)
Hemoglobin: 8.6 g/dL — ABNORMAL LOW (ref 13.0–17.0)
MCH: 29.3 pg (ref 26.0–34.0)
MCHC: 31 g/dL (ref 30.0–36.0)
MCV: 94.2 fL (ref 80.0–100.0)
Platelets: 279 10*3/uL (ref 150–400)
RBC: 2.94 MIL/uL — ABNORMAL LOW (ref 4.22–5.81)
RDW: 15.1 % (ref 11.5–15.5)
WBC: 17 10*3/uL — ABNORMAL HIGH (ref 4.0–10.5)
nRBC: 0 % (ref 0.0–0.2)

## 2022-07-17 LAB — MAGNESIUM: Magnesium: 1.9 mg/dL (ref 1.7–2.4)

## 2022-07-17 LAB — PROCALCITONIN: Procalcitonin: 0.27 ng/mL

## 2022-07-17 MED ORDER — SODIUM CHLORIDE 0.9 % IV SOLN
2.0000 g | Freq: Two times a day (BID) | INTRAVENOUS | Status: AC
  Administered 2022-07-17 – 2022-07-20 (×8): 2 g via INTRAVENOUS
  Filled 2022-07-17 (×8): qty 12.5

## 2022-07-17 MED ORDER — CHLORHEXIDINE GLUCONATE CLOTH 2 % EX PADS
6.0000 | MEDICATED_PAD | Freq: Every day | CUTANEOUS | Status: DC
  Administered 2022-07-17 – 2022-07-22 (×6): 6 via TOPICAL

## 2022-07-17 NOTE — Plan of Care (Signed)
  Problem: Clinical Measurements: Goal: Diagnostic test results will improve Outcome: Completed/Met

## 2022-07-17 NOTE — Progress Notes (Signed)
Pharmacy Antibiotic Note  Kevin Carr is a 88 y.o. male admitted on 07/15/2022 presenting from facility with concern for sepsis.  Pharmacy has been consulted for cefepime dosing.  Plan: Increase Cefepime 2g IV every 12 hours for Crcl >30 Monitor renal function, Cx and clinical progression to narrow    Height: '5\' 11"'$  (180.3 cm) Weight: 77.7 kg (171 lb 4.8 oz) IBW/kg (Calculated) : 75.3  Temp (24hrs), Avg:97.4 F (36.3 C), Min:97.3 F (36.3 C), Max:97.6 F (36.4 C)  Recent Labs  Lab 07/01/2022 1813 07/01/2022 2344 07/15/22 1132 07/15/22 1531 07/16/22 1044 07/17/22 0242  WBC 17.3*  --  14.2*  --  11.7* 17.0*  CREATININE 2.92*  --   --  1.88* 1.38* 1.47*  LATICACIDVEN 0.9 0.9  --   --   --   --      Estimated Creatinine Clearance: 32.7 mL/min (A) (by C-G formula based on SCr of 1.47 mg/dL (H)).    No Known Allergies  Kevin Carr A. Levada Dy, PharmD, BCPS, FNKF Clinical Pharmacist [place] Please utilize Amion for appropriate phone number to reach the unit pharmacist ([place])  07/17/2022 8:58 AM

## 2022-07-17 NOTE — Consult Note (Signed)
H&P Physician requesting consult: Wendee Beavers  Chief Complaint: Gross hematuria, left hydronephrosis  History of Present Illness: 88 year old male with a history of urinary retention with bilateral hydronephrosis and chronic Foley catheter since August.  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  He is generally followed by Dr. Louis Meckel up until 2016.  PSA at that time was 0.15.  He was seen by Dr. Alinda Money in consultation on 8/2 for a creatinine of 4.1 and urinary retention.  Creatinine improved with catheter placement.  On review of the scan from 8/2 with the distended bladder, there was significant left-sided bladder wall thickening.  This was compatible with possible bladder mass.  PSA at that time was 0.2 suggesting against localized prostate cancer recurrence.  He presented again to the hospital on 9/16 with acute renal insufficiency and pulmonary edema versus pneumonia.  He has had a progressive decline physically over the past couple months.  He presented with some hypotension and leukocytosis and elevated troponin likely secondary to demand ischemia as well as a BNP of 1050.  His Foley was exchanged in the emergency department.  He has been improving moderately in regards to his acute respiratory failure.  Sepsis has been ruled out.  His renal function has improved from admission and is stable at 1.47.  Hemoglobin stable at 8.6.  Patient has been having some gross hematuria.  He underwent repeat CT of the abdomen and pelvis without contrast today that revealed decompressed bladder around the Foley catheter with a large lobulated bladder mass better seen on prior CT with moderate left hydroureteronephrosis.  Right hydronephrosis has resolved and there was mild right hydroureter that was improved.  No obvious ureteral calculi.  He had enlarged prostate.  Bladder mass possibly contiguous with the prostate versus close association.  Gross hematuria is  improving.   Past Medical History:  Diagnosis Date   Cancer ([place])    prostate   GERD (gastroesophageal reflux disease)    GI bleed    Hiatal hernia    Hypercholesteremia    Osteoporosis    Thyroid disease    Past Surgical History:  Procedure Laterality Date   APPENDECTOMY     TONSILLECTOMY      Home Medications:  Medications Prior to Admission  Medication Sig Dispense Refill Last Dose   acetaminophen (TYLENOL) 500 MG tablet Take 500 mg by mouth 3 (three) times daily.   07/11/2022 at 1403   aspirin 81 MG tablet Take 81 mg by mouth daily.   07/25/2022 at 0823   DELSYM 30 MG/5ML liquid Take 60 mg by mouth every 12 (twelve) hours as needed for cough.   07/10/2022 at 0311   [EXPIRED] doxycycline (VIBRA-TABS) 100 MG tablet Take 100 mg by mouth 2 (two) times daily.   07/22/2022 at 0823   finasteride (PROSCAR) 5 MG tablet Take 5 mg by mouth daily.   07/26/2022 at 0823   furosemide (LASIX) 10 MG/ML injection Inject 15 mg into the muscle 3 (three) times daily.   07/18/2022 at 1403   ibuprofen (ADVIL) 200 MG tablet Take 400 mg by mouth every 6 (six) hours as needed (for pain).   07/07/2022 at 2033   levothyroxine (SYNTHROID, LEVOTHROID) 112 MCG tablet Take 112 mcg by mouth daily before breakfast.   07/28/2022 at 0522   melatonin 5 MG TABS Take 5 mg by mouth at bedtime.   07/13/2022 at 2037   [EXPIRED] MUCINEX 600 MG 12 hr tablet Take 600 mg  by mouth 2 (two) times daily.   07/15/2022 at 0823   Multiple Vitamin-Folic Acid TABS Take 1 tablet by mouth daily with breakfast.   06/29/2022 at 0823   omeprazole (PRILOSEC) 20 MG capsule Take 20 mg by mouth daily before breakfast.   07/23/2022 at 0823   polyethylene glycol (MIRALAX / GLYCOLAX) 17 g packet Take 17 g by mouth daily as needed for mild constipation. (Patient taking differently: Take 17 g by mouth daily as needed for mild constipation (mixed into 6 ounces of fluid).) 14 each 0 unk   Tamsulosin HCl (FLOMAX) 0.4 MG CAPS Take 1 capsule (0.4 mg total) by  mouth daily. 15 capsule 0 07/25/2022 at 0823   traMADol (ULTRAM) 50 MG tablet Take 25 mg by mouth 2 (two) times daily as needed (for pain).   07/10/2022 at 0311   traMADol-acetaminophen (ULTRACET) 37.5-325 MG tablet Take 0.5 tablets by mouth 3 (three) times daily.   07/28/2022 at 1403   senna-docusate (SENOKOT-S) 8.6-50 MG tablet Take 1 tablet by mouth 2 (two) times daily. (Patient not taking: Reported on 07/13/2022)   Not Taking   Allergies: No Known Allergies  History reviewed. No pertinent family history. Social History:  reports that he has never smoked. He has never used smokeless tobacco. He reports that he does not drink alcohol and does not use drugs.  ROS: A complete review of systems was performed.  All systems are negative except for pertinent findings as noted. ROS   Physical Exam:  Vital signs in last 24 hours: Temp:  [97.4 F (36.3 C)-97.6 F (36.4 C)] 97.6 F (36.4 C) (09/19 1734) Pulse Rate:  [70-80] 76 (09/19 1734) Resp:  [16-20] 16 (09/19 1734) BP: (92-129)/(44-83) 124/82 (09/19 1734) SpO2:  [91 %-100 %] 97 % (09/19 1734) General:  Alert and oriented, No acute distress HEENT: Normocephalic, atraumatic Neck: No JVD or lymphadenopathy Cardiovascular: Regular rate and rhythm Lungs: Regular rate and effort Abdomen: Soft, nontender, nondistended, no abdominal masses Genitourinary: Circumcised phallus.  Foley catheter in place draining amber urine in the proximal portion of the tubing suggesting against significant ongoing bleeding Back: No CVA tenderness Extremities: No edema Neurologic: Grossly intact  Laboratory Data:  Results for orders placed or performed during the hospital encounter of 07/11/2022 (from the past 24 hour(s))  Procalcitonin     Status: None   Collection Time: 07/17/22  2:42 AM  Result Value Ref Range   Procalcitonin 0.27 ng/mL  Renal function panel     Status: Abnormal   Collection Time: 07/17/22  2:42 AM  Result Value Ref Range   Sodium 137 135  - 145 mmol/L   Potassium 3.7 3.5 - 5.1 mmol/L   Chloride 103 98 - 111 mmol/L   CO2 26 22 - 32 mmol/L   Glucose, Bld 98 70 - 99 mg/dL   BUN 36 (H) 8 - 23 mg/dL   Creatinine, Ser 1.47 (H) 0.61 - 1.24 mg/dL   Calcium 8.5 (L) 8.9 - 10.3 mg/dL   Phosphorus 2.4 (L) 2.5 - 4.6 mg/dL   Albumin 2.5 (L) 3.5 - 5.0 g/dL   GFR, Estimated 44 (L) >60 mL/min   Anion gap 8 5 - 15  Magnesium     Status: None   Collection Time: 07/17/22  2:42 AM  Result Value Ref Range   Magnesium 1.9 1.7 - 2.4 mg/dL  CBC     Status: Abnormal   Collection Time: 07/17/22  2:42 AM  Result Value Ref Range   WBC 17.0 (  H) 4.0 - 10.5 K/uL   RBC 2.94 (L) 4.22 - 5.81 MIL/uL   Hemoglobin 8.6 (L) 13.0 - 17.0 g/dL   HCT 27.7 (L) 39.0 - 52.0 %   MCV 94.2 80.0 - 100.0 fL   MCH 29.3 26.0 - 34.0 pg   MCHC 31.0 30.0 - 36.0 g/dL   RDW 15.1 11.5 - 15.5 %   Platelets 279 150 - 400 K/uL   nRBC 0.0 0.0 - 0.2 %  Brain natriuretic peptide     Status: Abnormal   Collection Time: 07/17/22  2:42 AM  Result Value Ref Range   B Natriuretic Peptide 866.3 (H) 0.0 - 100.0 pg/mL   Recent Results (from the past 240 hour(s))  Blood culture (routine x 2)     Status: None (Preliminary result)   Collection Time: 07/13/2022  6:14 PM   Specimen: BLOOD RIGHT HAND  Result Value Ref Range Status   Specimen Description BLOOD RIGHT HAND  Final   Special Requests   Final    BOTTLES DRAWN AEROBIC AND ANAEROBIC Blood Culture adequate volume   Culture   Final    NO GROWTH 3 DAYS Performed at [facility], [address] [place], [place] [address]    Report Status PENDING  Incomplete  Urine Culture     Status: None   Collection Time: 07/20/2022  6:25 PM   Specimen: Urine, Clean Catch  Result Value Ref Range Status   Specimen Description URINE, CLEAN CATCH  Final   Special Requests NONE  Final   Culture   Final    NO GROWTH Performed at [facility], [place] [address], [place], [place] [address]    Report Status 07/16/2022 FINAL  Final   MRSA Next Gen by PCR, Nasal     Status: None   Collection Time: 07/28/2022 11:02 PM   Specimen: Nasal Mucosa; Nasal Swab  Result Value Ref Range Status   MRSA by PCR Next Gen NOT DETECTED NOT DETECTED Final    Comment: (NOTE) The GeneXpert MRSA Assay (FDA approved for NASAL specimens only), is one component of a comprehensive MRSA colonization surveillance program. It is not intended to diagnose MRSA infection nor to guide or monitor treatment for MRSA infections. Test performance is not FDA approved in patients less than 95 years old. Performed at [facility], [place] [address], [place], [place] [address]   Blood culture (routine x 2)     Status: None (Preliminary result)   Collection Time: 07/09/2022 11:19 PM   Specimen: BLOOD  Result Value Ref Range Status   Specimen Description BLOOD LEFT ANTECUBITAL  Final   Special Requests   Final    BOTTLES DRAWN AEROBIC AND ANAEROBIC Blood Culture adequate volume   Culture   Final    NO GROWTH 2 DAYS Performed at [facility], [place] [address], [place],  [address]    Report Status PENDING  Incomplete   Creatinine: Recent Labs    07/19/2022 1813 07/15/22 1531 07/16/22 1044 07/17/22 0242  CREATININE 2.92* 1.88* 1.38* 1.47*   CT scan personally reviewed and is detailed in the history of present illness  Impression/Assessment:  Gross hematuria Left hydronephrosis Possible bladder mass History of prostate cancer status post radiation  Plan:  Patient has persistent left hydronephrosis despite Foley catheter decompression.  He has a possible bladder mass.  He is having some gross hematuria but this is improving.  Hemoglobin has been stable and creatinine has improved to 1.5.  I recommended against any surgical intervention at this time.  PSA in August was 0.2 suggesting against locally aggressive prostate cancer recurrence.  He will need a cystoscopy for evaluation of the bladder.  I discussed the differential diagnosis  of bladder cancer.  If it were indeed bladder cancer, hydronephrosis would suggest invasive urothelial carcinoma.  We would not know that without tissue diagnosis.  Would not recommend nonurgent surgery at this time.  Hydronephrosis is stable on the left.  If he does have a bladder mass, there is a good likelihood we would not be able to see the left ureteral orifice for stent.  Further, his creatinine has improved to 1.5.  I would recommend against a stent at this time.  Continue Foley catheter.  Nursing may flush as needed.  Patient and son were in full agreement.  Marton Redwood, III 07/17/2022, 6:55 PM

## 2022-07-17 NOTE — Progress Notes (Signed)
PROGRESS NOTE  Kevin Carr BWG:[id number] DOB: 23-Oct-1926   PCP: Lujean Amel, MD  Patient is from: [place] [facility]  DOA: 07/12/2022 LOS: 3  Chief complaints Chief Complaint  Patient presents with   Weakness     Brief Narrative / Interim history: 88 year old M with PMH of large hiatal hernia, urinary retention/bilateral hydroureteronephrosis with chronic Foley catheter and prostate cancer presenting with abnormal labs including elevated creatinine and chest x-ray concerning for pulmonary edema, and admitted for aspiration pneumonia and AKI.  Family reports overall decline over the last 2 months, mainly over the last 1 week.   In ED, slightly hypotensive to 90/55.  Saturating at 100% on 4 L. Cr 2.92 (baseline 1.0).  BUN 50.  WBC 17.3 with left shift.  Hgb 8.5 (baseline 10-11).  Troponin 481.  BNP 1050.  Lactic acid 0.9.  EKG NSR with LVH but no acute ischemic finding.  UA with large LE and large Hgb with 21-50 RBC per hpf but obtained from chronic Foley.  Foley catheter exchanged in ED.  CXR showed large hiatal hernia, right pleural effusion with associated atelectasis/airspace disease and left basilar atelectasis/airspace disease.  Cultures obtained.  CT chest ordered.  Patient was started on IV cefepime and admitted.  CT chest with progressive RML and LLL consolidation, bilateral pleural effusion, large hiatal hernia with the stomach and portion of transverse colon within the chest.  IR consulted for thoracocentesis but did not appreciate enough fluid for intervention.  Renal US with moderate left hydronephrosis and 13 mm nonobstructing calculus in left lower pole.  AKI resolving.  Remains on IV cefepime and Flagyl for aspiration pneumonia.  Started IV Lasix with albumin for volume overload.  TTE pending.  Now with hematuria.  Urology consulted  Therapy recommended SNF.   Subjective: Seen and examined earlier this morning.  No major events overnight of this morning.  Continues to  endorse shortness of breath and cough.  He denies chest pain.  He has gross hematuria with dark red urine in Foley bag but no blood clots.   Objective: Vitals:   07/16/22 1551 07/16/22 2120 07/17/22 0622 07/17/22 0905  BP: 114/61 124/83 129/63 (!) 92/44  Pulse: 70 80 74 70  Resp: _0 Temp: (!) 97.3 F (36.3 C) (!) 97.4 F (36.3 C) 97.6 F (36.4 C)   TempSrc: Oral  Axillary   SpO2: 96% 91% 100% 99%  Weight:      Height:        Examination:  GENERAL: Appears frail.  No apparent distress. HEENT: MMM.  Vision and hearing grossly intact.  NECK: Supple.  No apparent JVD.  RESP:  No IWOB.  Rhonchorous. CVS:  RRR. Heart sounds normal.  ABD/GI/GU: BS+. Abd soft, NTND.  Foley catheter in place.  Gross hematuria in Foley bag. MSK/EXT:  Moves extremities. No apparent deformity.  1+ edema in all extremities SKIN: no apparent skin lesion or wound NEURO: Awake and alert. Oriented fairly.  No apparent focal neuro deficit. PSYCH: Calm. Normal affect.   Procedures:  None  Microbiology summarized: MRSA PCR screen nonreactive. Blood cultures NGTD. Urine culture NGTD.  Assessment and plan: Principal Problem:   Aspiration pneumonia ([place]) Active Problems:   UTI (urinary tract infection) due to urinary indwelling Foley catheter (H[facility])   Acute respiratory failure with hypoxia ([facility])   AKI (acute kidney injury) ([place])   Hypothyroidism   Large hiatal hernia   Wheelchair dependence   Urinary retention   Goals of care,  counseling/discussion   Elevated troponin   Acute respiratory failure with hypoxia likely due to aspiration pneumonia, pleural effusion, atelectasis and possible acute unknown type CHF.   CXR with significant right pleural effusion and RLL opacity. CT chest with progressive RML and LLL consolidation, bilateral pleural effusion, large hiatal hernia with the stomach and portion of transverse colon within the chest. Given patient's known large hiatal hernia, significant  concern for aspiration pneumonia.  Pro-Cal elevated but improving.  Pleural effusion, elevated BNP and troponin also raises concern for acute CHF.  Low suspicion for PE. -Continue IV cefepime and Flagyl -Wean oxygen as able, incentive telemetry, OOB/PT/OT -IR consulted for thoracocentesis but not enough fluid by bedside ultrasound -Continue IV Lasix with IV albumin -Follow echocardiogram -Trend leukocytosis and procalcitonin -Aspiration precaution and SLP eval -Patient has not met sepsis criteria yet.  Sepsis ruled out.  Right pleural effusion-unclear if this is CHF related or parapneumonic. -IR consulted but did not appreciate enough fluid by bedside US. -Follow echocardiogram -IV Lasix with albumin as above.  Gross hematuria/pyuria-traumatic from Foley replacement?  UA with pyuria and hematuria but culture negative. BPH/chronic urinary retention with chronic Foley. Moderate left hydronephrosis/nephrolithiasis: on renal US along with 13 mm nonobstructing calculus in left lower pole.  -Urology to see patient.  Meanwhile, recommended repeat CT without contrast and bladder irrigation. -Discontinue subcu heparin -Continue home Proscar and Foley catheter. -Antibiotics as above -UTI ruled out.   AKI/azotemia: Likely prerenal from poor p.o. intake.  Seems he is also on diuretics and ibuprofen.  Renal US with moderate hydronephrosis.  Improving. Recent Labs    05/31/22 0437 06/01/22 0812 06/02/22 0336 06/03/22 0854 06/04/22 0759 06/05/22 0617 07/08/2022 1813 07/15/22 1531 07/16/22 1044 07/17/22 0242  BUN 125* 76* 48* 30* 26* 24* 50* 44* 35* 36*  CREATININE 2.44* 1.43* 1.16 1.02 1.35* 1.04 2.92* 1.88* 1.38* 1.47*  -Closely monitor urine output -Monitor renal function  Acute blood loss anemia the setting of gross hematuria Recent Labs    05/31/22 0437 06/01/22 0812 06/02/22 0336 06/03/22 0854 06/04/22 0759 06/05/22 0617 07/09/2022 1813 07/15/22 1132 07/16/22 1044  07/17/22 0242  HGB 11.8* 10.6* 10.4* 10.6* 11.9* 10.5* 8.5* 8.9* 9.5* 8.6*  -Continue monitoring -Discontinue subcu heparin  BLE edema, left > right. -Check venous Doppler to exclude DVT  Elevated troponin: Patient without chest pain.  EKG without acute finding.  Suspect demand ischemia from hypoxia and infection as well as delayed clearance from AKI.  No significant delta either. -Follow echocardiogram  Large hiatal hernia:  -Aspiration precaution and SLP eval  Generalized weakness/physical deconditioning -PT/OT  Goal of care counseling: Remains full code.  See goals of care discussion on 9/17. -Palliative medicine consulted  Body mass index is 23.89 kg/m.    DVT prophylaxis:  SCDs Start: 07/15/22 0815  Code Status: Full code Family Communication: None at bedside Level of care: Telemetry Medical Status is: Inpatient Remains inpatient appropriate because: Aspiration pneumonia, fluid overload and gross hematuria   Final disposition: TBD Consultants:  Interventional radiology Palliative medicine Urology  Sch Meds:  Scheduled Meds:  Chlorhexidine Gluconate Cloth  6 each Topical Daily   finasteride  5 mg Oral Daily   furosemide  40 mg Intravenous Daily   levothyroxine  112 mcg Oral Q0600   lidocaine  1 Application Urethral Once   Continuous Infusions:  albumin human 25 g (07/17/22 1040)   ceFEPime (MAXIPIME) IV 2 g (07/17/22 1214)   metronidazole 500 mg (07/17/22 0814)   PRN Meds:.acetaminophen **OR** acetaminophen,  albuterol, melatonin, ondansetron **OR** ondansetron (ZOFRAN) IV  Antimicrobials: Anti-infectives (From admission, onward)    Start     Dose/Rate Route Frequency Ordered Stop   07/17/22 1000  ceFEPIme (MAXIPIME) 2 g in sodium chloride 0.9 % 100 mL IVPB        2 g 200 mL/hr over 30 Minutes Intravenous Every 12 hours 07/17/22 0858     07/16/22 2200  ceFEPIme (MAXIPIME) 2 g in sodium chloride 0.9 % 100 mL IVPB  Status:  Discontinued        2  g 200 mL/hr over 30 Minutes Intravenous Every 24 hours 07/16/22 0921 07/17/22 0858   07/16/22 1000  ceFEPIme (MAXIPIME) 2 g in sodium chloride 0.9 % 100 mL IVPB  Status:  Discontinued        2 g 200 mL/hr over 30 Minutes Intravenous Every 12 hours 07/16/22 0920 07/16/22 0921   07/15/22 2000  ceFEPIme (MAXIPIME) 2 g in sodium chloride 0.9 % 100 mL IVPB  Status:  Discontinued        2 g 200 mL/hr over 30 Minutes Intravenous Every 24 hours 07/03/2022 1922 07/16/22 0920   07/15/22 0915  metroNIDAZOLE (FLAGYL) IVPB 500 mg        500 mg 100 mL/hr over 60 Minutes Intravenous Every 12 hours 07/15/22 0815 07/20/22 0914   07/01/2022 1930  vancomycin (VANCOREADY) IVPB 1500 mg/300 mL        1,500 mg 150 mL/hr over 120 Minutes Intravenous  Once 06/29/2022 1920 07/12/2022 2356   07/09/2022 1922  vancomycin variable dose per unstable renal function (pharmacist dosing)  Status:  Discontinued         Does not apply See admin instructions 07/05/2022 1922 07/15/22 1419   07/17/2022 1915  ceFEPIme (MAXIPIME) 2 g in sodium chloride 0.9 % 100 mL IVPB        2 g 200 mL/hr over 30 Minutes Intravenous  Once 07/19/2022 1913 06/29/2022 2354        I have personally reviewed the following labs and images: CBC: Recent Labs  Lab 06/29/2022 1813 07/15/22 1132 07/16/22 1044 07/17/22 0242  WBC 17.3* 14.2* 11.7* 17.0*  NEUTROABS 15.8* 12.6*  --   --   HGB 8.5* 8.9* 9.5* 8.6*  HCT 27.0* 27.8* 30.7* 27.7*  MCV 93.8 93.9 95.3 94.2  PLT 249 223 303 279   BMP &GFR Recent Labs  Lab 07/22/2022 1813 07/15/22 1531 07/16/22 1044 07/17/22 0242  NA 136 136 138 137  K 5.0 4.4 4.1 3.7  CL 100 103 105 103  CO2 22 21* 24 26  GLUCOSE 112* 108* 94 98  BUN 50* 44* 35* 36*  CREATININE 2.92* 1.88* 1.38* 1.47*  CALCIUM 8.4* 8.4* 8.5* 8.5*  MG  --  2.0 2.0 1.9  PHOS  --   --  2.8 2.4*   Estimated Creatinine Clearance: 32.7 mL/min (A) (by C-G formula based on SCr of 1.47 mg/dL (H)). Liver & Pancreas: Recent Labs  Lab 07/06/2022 1813  07/15/22 1531 07/16/22 1044 07/17/22 0242  AST 18 20  --   --   ALT 12 9  --   --   ALKPHOS 101 100  --   --   BILITOT 0.6 0.3  --   --   PROT 5.5* 5.5*  --   --   ALBUMIN 2.1* 2.1* 2.2* 2.5*   Recent Labs  Lab 07/06/2022 1813  LIPASE 21   No results for input(s): "AMMONIA" in the last 168 hours. Diabetic: No results  for input(s): "HGBA1C" in the last 72 hours. No results for input(s): "GLUCAP" in the last 168 hours. Cardiac Enzymes: No results for input(s): "CKTOTAL", "CKMB", "CKMBINDEX", "TROPONINI" in the last 168 hours. No results for input(s): "PROBNP" in the last 8760 hours. Coagulation Profile: No results for input(s): "INR", "PROTIME" in the last 168 hours. Thyroid Function Tests: No results for input(s): "TSH", "T4TOTAL", "FREET4", "T3FREE", "THYROIDAB" in the last 72 hours. Lipid Profile: No results for input(s): "CHOL", "HDL", "LDLCALC", "TRIG", "CHOLHDL", "LDLDIRECT" in the last 72 hours. Anemia Panel: No results for input(s): "VITAMINB12", "FOLATE", "FERRITIN", "TIBC", "IRON", "RETICCTPCT" in the last 72 hours. Urine analysis:    Component Value Date/Time   COLORURINE YELLOW 07/23/2022 1825   APPEARANCEUR HAZY (A) 07/06/2022 1825   LABSPEC 1.012 07/02/2022 1825   PHURINE 5.0 07/01/2022 1825   GLUCOSEU NEGATIVE 07/21/2022 1825   HGBUR LARGE (A) 07/21/2022 1825   BILIRUBINUR NEGATIVE 07/06/2022 1825   KETONESUR NEGATIVE 07/09/2022 1825   PROTEINUR 100 (A) 07/28/2022 1825   UROBILINOGEN 1.0 10/27/2012 1111   NITRITE NEGATIVE 07/21/2022 1825   LEUKOCYTESUR LARGE (A) 07/10/2022 1825   Sepsis Labs: Invalid input(s): "PROCALCITONIN", "LACTICIDVEN"  Microbiology: Recent Results (from the past 240 hour(s))  Blood culture (routine x 2)     Status: None (Preliminary result)   Collection Time: 06/30/2022  6:14 PM   Specimen: BLOOD RIGHT HAND  Result Value Ref Range Status   Specimen Description BLOOD RIGHT HAND  Final   Special Requests   Final    BOTTLES DRAWN  AEROBIC AND ANAEROBIC Blood Culture adequate volume   Culture   Final    NO GROWTH 3 DAYS Performed at [facility], [place] [address] [place], [place] [address]    Report Status PENDING  Incomplete  Urine Culture     Status: None   Collection Time: 07/15/2022  6:25 PM   Specimen: Urine, Clean Catch  Result Value Ref Range Status   Specimen Description URINE, CLEAN CATCH  Final   Special Requests NONE  Final   Culture   Final    NO GROWTH Performed at [facility], [place] [address], [place], [place] [address]    Report Status 07/16/2022 FINAL  Final  MRSA Next Gen by PCR, Nasal     Status: None   Collection Time: 07/10/2022 11:02 PM   Specimen: Nasal Mucosa; Nasal Swab  Result Value Ref Range Status   MRSA by PCR Next Gen NOT DETECTED NOT DETECTED Final    Comment: (NOTE) The GeneXpert MRSA Assay (FDA approved for NASAL specimens only), is one component of a comprehensive MRSA colonization surveillance program. It is not intended to diagnose MRSA infection nor to guide or monitor treatment for MRSA infections. Test performance is not FDA approved in patients less than 57 years old. Performed at [facility], [place] [address] [place], [place] [address]   Blood culture (routine x 2)     Status: None (Preliminary result)   Collection Time: 07/01/2022 11:19 PM   Specimen: BLOOD  Result Value Ref Range Status   Specimen Description BLOOD LEFT ANTECUBITAL  Final   Special Requests   Final    BOTTLES DRAWN AEROBIC AND ANAEROBIC Blood Culture adequate volume   Culture   Final    NO GROWTH 2 DAYS Performed at [facility], [place] [address], [place], [place] [address]    Report Status PENDING  Incomplete    Radiology Studies: No results found.  Teresa Lemmerman T. Hester  If 7PM-7AM, please contact night-coverage [email or web address] 07/17/2022, 2:01 PM
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
## 2022-07-18 ENCOUNTER — Inpatient Hospital Stay (HOSPITAL_COMMUNITY): Payer: Medicare Other

## 2022-07-18 ENCOUNTER — Inpatient Hospital Stay (HOSPITAL_BASED_OUTPATIENT_CLINIC_OR_DEPARTMENT_OTHER): Payer: Medicare Other

## 2022-07-18 DIAGNOSIS — L538 Other specified erythematous conditions: Secondary | ICD-10-CM | POA: Diagnosis not present

## 2022-07-18 DIAGNOSIS — N179 Acute kidney failure, unspecified: Secondary | ICD-10-CM | POA: Diagnosis not present

## 2022-07-18 DIAGNOSIS — Z7189 Other specified counseling: Secondary | ICD-10-CM | POA: Diagnosis not present

## 2022-07-18 DIAGNOSIS — R0609 Other forms of dyspnea: Secondary | ICD-10-CM

## 2022-07-18 DIAGNOSIS — J690 Pneumonitis due to inhalation of food and vomit: Secondary | ICD-10-CM | POA: Diagnosis not present

## 2022-07-18 DIAGNOSIS — J9601 Acute respiratory failure with hypoxia: Secondary | ICD-10-CM | POA: Diagnosis not present

## 2022-07-18 LAB — RENAL FUNCTION PANEL
Albumin: 2.4 g/dL — ABNORMAL LOW (ref 3.5–5.0)
Anion gap: 6 (ref 5–15)
BUN: 26 mg/dL — ABNORMAL HIGH (ref 8–23)
CO2: 29 mmol/L (ref 22–32)
Calcium: 8.3 mg/dL — ABNORMAL LOW (ref 8.9–10.3)
Chloride: 104 mmol/L (ref 98–111)
Creatinine, Ser: 1.14 mg/dL (ref 0.61–1.24)
GFR, Estimated: 60 mL/min — ABNORMAL LOW (ref >60)
Glucose, Bld: 95 mg/dL (ref 70–99)
Phosphorus: 2.3 mg/dL — ABNORMAL LOW (ref 2.5–4.6)
Potassium: 3.3 mmol/L — ABNORMAL LOW (ref 3.5–5.1)
Sodium: 139 mmol/L (ref 135–145)

## 2022-07-18 LAB — ECHOCARDIOGRAM COMPLETE
AR max vel: 2.65 cm2
AV Area VTI: 2.42 cm2
AV Area mean vel: 2.6 cm2
AV Mean grad: 5 mmHg
AV Peak grad: 9.2 mmHg
Ao pk vel: 1.52 m/s
Area-P 1/2: 2.83 cm2
Height: 71 in
S' Lateral: 2.6 cm
Weight: 2740.76 oz

## 2022-07-18 LAB — CBC
HCT: 24.1 % — ABNORMAL LOW (ref 39.0–52.0)
Hemoglobin: 7.9 g/dL — ABNORMAL LOW (ref 13.0–17.0)
MCH: 30.2 pg (ref 26.0–34.0)
MCHC: 32.8 g/dL (ref 30.0–36.0)
MCV: 92 fL (ref 80.0–100.0)
Platelets: 241 10*3/uL (ref 150–400)
RBC: 2.62 MIL/uL — ABNORMAL LOW (ref 4.22–5.81)
RDW: 15.1 % (ref 11.5–15.5)
WBC: 11.4 10*3/uL — ABNORMAL HIGH (ref 4.0–10.5)
nRBC: 0 % (ref 0.0–0.2)

## 2022-07-18 LAB — MAGNESIUM: Magnesium: 1.8 mg/dL (ref 1.7–2.4)

## 2022-07-18 LAB — BRAIN NATRIURETIC PEPTIDE: B Natriuretic Peptide: 540.3 pg/mL — ABNORMAL HIGH (ref 0.0–100.0)

## 2022-07-18 MED ORDER — METRONIDAZOLE 500 MG PO TABS
500.0000 mg | ORAL_TABLET | Freq: Two times a day (BID) | ORAL | Status: AC
  Administered 2022-07-18 – 2022-07-19 (×3): 500 mg via ORAL
  Filled 2022-07-18 (×3): qty 1

## 2022-07-18 MED ORDER — POTASSIUM CHLORIDE 20 MEQ PO PACK
60.0000 meq | PACK | Freq: Once | ORAL | Status: AC
  Administered 2022-07-18: 60 meq via ORAL
  Filled 2022-07-18: qty 3

## 2022-07-18 NOTE — Hospital Course (Addendum)
88 year old M with PMH of large hiatal hernia, urinary retention/bilateral hydroureteronephrosis with chronic Foley catheter and prostate cancer presented with abnormal labs including elevated creatinine and chest x-ray concerning for pulmonary edema, and admitted for aspiration pneumonia and AKI.  Family initially reported overall decline over the last 2 months, mainly over the last 1 week prior to admit   In ED, slightly hypotensive to 90/55.  Saturating at 100% on 4 L. Cr 2.92 (baseline 1.0).  BUN 50.  WBC 17.3 with left shift.  Hgb 8.5 (baseline 10-11).  Troponin 481.  BNP 1050.  Lactic acid 0.9.  EKG NSR with LVH but no acute ischemic finding.  UA with large LE and large Hgb with 21-50 RBC per hpf but obtained from chronic Foley.  Foley catheter exchanged in ED.  CXR showed large hiatal hernia, right pleural effusion with associated atelectasis/airspace disease and left basilar atelectasis/airspace disease.  Patient was started on empiric antibiotics and admitted.   CT chest with progressive RML and LLL consolidation, bilateral pleural effusion, large hiatal hernia with the stomach and portion of transverse colon within the chest.  IR consulted for thoracocentesis but did not appreciate enough fluid for intervention.  Renal US with moderate left hydronephrosis and 13 mm nonobstructing calculus in left lower pole.  AKI gradually improved.  Patient was later started IV Lasix with albumin for volume overload.  Urology was consulted this visit  During further workup, pt was later found to have extensive BLE DVT.

## 2022-07-18 NOTE — Progress Notes (Signed)
Progress Note   Patient: Kevin Carr:[id number] DOB: [birth year] DOA: 07/26/2022     4 DOS: the patient was seen and examined on 07/18/2022   Brief hospital course: 88 year old M with PMH of large hiatal hernia, urinary retention/bilateral hydroureteronephrosis with chronic Foley catheter and prostate cancer presenting with abnormal labs including elevated creatinine and chest x-ray concerning for pulmonary edema, and admitted for aspiration pneumonia and AKI.  Family reports overall decline over the last 2 months, mainly over the last 1 week.    In ED, slightly hypotensive to 90/55.  Saturating at 100% on 4 L. Cr 2.92 (baseline 1.0).  BUN 50.  WBC 17.3 with left shift.  Hgb 8.5 (baseline 10-11).  Troponin 481.  BNP 1050.  Lactic acid 0.9.  EKG NSR with LVH but no acute ischemic finding.  UA with large LE and large Hgb with 21-50 RBC per hpf but obtained from chronic Foley.  Foley catheter exchanged in ED.  CXR showed large hiatal hernia, right pleural effusion with associated atelectasis/airspace disease and left basilar atelectasis/airspace disease.  Cultures obtained.  CT chest ordered.  Patient was started on IV cefepime and admitted.   CT chest with progressive RML and LLL consolidation, bilateral pleural effusion, large hiatal hernia with the stomach and portion of transverse colon within the chest.  IR consulted for thoracocentesis but did not appreciate enough fluid for intervention.  Renal US with moderate left hydronephrosis and 13 mm nonobstructing calculus in left lower pole.  AKI resolving.  Remains on IV cefepime and Flagyl for aspiration pneumonia.  Started IV Lasix with albumin for volume overload.  TTE pending.  Now with hematuria.  Urology consulted  During further workup, pt was found to have extensive BLE DVT.  Assessment and Plan: Acute respiratory failure with hypoxia likely due to aspiration pneumonia, pleural effusion, atelectasis and possible acute unknown type CHF.    CXR with significant right pleural effusion and RLL opacity. CT chest with progressive RML and LLL consolidation, bilateral pleural effusion, large hiatal hernia with the stomach and portion of transverse colon within the chest. Given patient's known large hiatal hernia, significant concern for aspiration pneumonia.  Pro-Cal elevated but improving.  Pleural effusion, elevated BNP and troponin initially raised concern for acute CHF.  Low suspicion for PE. -Continued on IV cefepime and Flagyl -Wean oxygen as able, incentive telemetry, OOB/PT/OT -IR consulted for thoracocentesis but not enough fluid by bedside ultrasound -Continued on IV Lasix with IV albumin -2d echo reviewed. Findings of normal LVEF. RV function is normal -Trend leukocytosis and procalcitonin -Aspiration precaution and SLP eval -Patient has not met sepsis criteria yet.  Sepsis ruled out.   Right pleural effusion-unclear if this is CHF related or parapneumonic. -IR consulted but did not appreciate enough fluid by bedside US. -Continued on IV Lasix with albumin as above.   Gross hematuria/pyuria-traumatic from Foley replacement?  UA with pyuria and hematuria but culture negative. BPH/chronic urinary retention with chronic Foley. Moderate left hydronephrosis/nephrolithiasis: on renal US along with 13 mm nonobstructing calculus in left lower pole.  -CT abd reviewed. Findings notable for large bladder mass with continued hydronephrosis. -Urology was consulted.  -Continue home Proscar and Foley catheter. Urology recommendations against surgical managment -UTI ruled out.  -See below. Findings of extensive BLE DVT burden. Discussed with Urology, does not recommend anticoagulation in light of hematuria   AKI/azotemia: Likely prerenal from poor p.o. intake.  Seems he is also on diuretics and ibuprofen.  Renal US with moderate hydronephrosis.  Improving. -recheck bmet in AM   Acute blood loss anemia the setting of gross  hematuria -Continue monitoring -Discontinue subcu heparin   BLE edema, left > right secondary to BLE DVT -LE dopplers reviewed, findings notable for extensive BLE DVT -Given hematuria, not candidate for anticoagulation -Will discuss with family. Unclear if pt would be ideal candidate for IVC filter   Elevated troponin: Patient without chest pain.  EKG without acute finding.  Suspect demand ischemia from hypoxia and infection as well as delayed clearance from AKI.  No significant delta either. -2d echo per above   Large hiatal hernia:  -Aspiration precaution and SLP eval   Generalized weakness/physical deconditioning -PT/OT   Goal of care counseling: Remains full code.  See goals of care discussion on 9/17. -Palliative medicine consulted   Body mass index is 23.89 kg/m.       Subjective: Without complaints this AM  Physical Exam: Vitals:   07/17/22 1734 07/17/22 1955 07/18/22 0517 07/18/22 1030  BP: 124/82 107/72 (!) 121/54 (!) 110/93  Pulse: 76 75 68 73  Resp: $Remo'16 17 18 17  'lRFXG$ Temp: 97.6 F (36.4 C) 98.1 F (36.7 C) 98.1 F (36.7 C) 97.9 F (36.6 C)  TempSrc: Oral Oral Oral Oral  SpO2: 97% 100% 99% 94%  Weight:      Height:       General exam: Awake, laying in bed, in nad Respiratory system: Normal respiratory effort, no wheezing Cardiovascular system: regular rate, s1, s2 Gastrointestinal system: Soft, nondistended, positive BS Central nervous system: CN2-12 grossly intact, strength intact Extremities: Perfused, no clubbing, BLE edema Skin: Normal skin turgor, no notable skin lesions seen Psychiatry: Mood normal // no visual hallucinations   Data Reviewed:  Labs reviewed: Na 139, K 3.3, Cr 1.14, Hgb 7.9   Family Communication: Pt in room, family not at bedside. Attempted to call pt's son, no answer. Will try again later  Disposition: Status is: Inpatient Remains inpatient appropriate because: Severity of illness  Planned Discharge Destination:  Home     Author: Marylu Lund, MD 07/18/2022 3:15 PM  For on call review [email or web address].

## 2022-07-18 NOTE — Progress Notes (Signed)
Palliative Medicine Progress Note   Patient Name: Kevin Carr       Date: 07/18/2022 DOB: [birth year]  Age: 88 y.o. MRN#: [id number] Attending Physician: Donne Hazel, MD Primary Care Physician: Lujean Amel, MD Admit Date: 07/06/2022    HPI/Patient Profile: 88 y.o. male  with past medical history of prostate cancer, urinary retention/bilateral hydroureteronephrosis with chronic Foley catheter, and large hiatal hernia.  He presented to [facility] ED from [facility] on 07/12/2022 with elevated creatinine and chest x-ray concerning for pulmonary edema.  He was admitted to [facility] service with aspiration pneumonia and AKI.  Subjective: Chart reviewed. Patient was seen by urology yesterday 9/19 for gross hematuria and persistent left hydronephrosis despite Foley catheter decompression. He has a possible bladder mass with differential diagnosis of bladder cancer. As gross hematuria is improving, hemoglobin is stable, and creatinine has improved to 1.5, urology does not recommend surgical intervention at this time.   I assessed patient at bedside. He is out of bed to the recliner and somewhat more interactive today, however he continues to report shortness of breath.   I spoke with son briefly by phone and discussed recommendations from urology. He does not have any additional questions or concerns at this time.   I was later notified by Dr. Wyline Copas (via secure chat), that doppler study today showed extensive BLE DVT. Patient is not a candidate for anticoagulation at this time due to hematuria, so will need to know if son wants to pursue IVC filter.    Objective:  Physical Exam Vitals reviewed.  Constitutional:      General: He is not in acute distress.    Appearance: He is ill-appearing.  Pulmonary:      Effort: Pulmonary effort is normal.     Comments: Congested cough Neurological:     Mental Status: He is alert.     Motor: Weakness present.     Comments: Oriented to person and place             Vital Signs: BP (!) 132/57   Pulse 71   Temp (!) 97.5 F (36.4 C) (Oral)   Resp 17   Ht '5\' 11"'$  (1.803 m)   Wt 77.7 kg   SpO2 97%   BMI 23.89 kg/m  SpO2: SpO2: 97 % O2 Device: O2 Device: Nasal Cannula  O2 Flow Rate: O2 Flow Rate (L/min): 2 L/min     LBM: Last BM Date : 07/15/22      Palliative Medicine Assessment & Plan   Assessment: Principal Problem:   Aspiration pneumonia ([place]) Active Problems:   AKI (acute kidney injury) ([place])   Hypothyroidism   UTI (urinary tract infection) due to urinary indwelling Foley catheter ([place])   Acute respiratory failure with hypoxia ([place])   Large hiatal hernia   Wheelchair dependence   Urinary retention   Goals of care, counseling/discussion   Elevated troponin   Gross hematuria    Recommendations/Plan: Full code  Continue current full scope interventions Outpatient Palliative referral for ongoing support at discharge PMT will continue to follow   Prognosis:  Very guarded  Discharge Planning: [place] for rehab with Palliative care service follow-up   Thank you for allowing the Palliative Medicine Team to assist in the care of this patient.   MDM - moderate   Lavena Bullion, NP   Please contact Palliative Medicine Team phone at [phone] for questions and concerns.  For individual providers, please see AMION.

## 2022-07-18 NOTE — Progress Notes (Signed)
Lower extremity venous bilateral study completed.  Preliminary results relayed to Wyline Copas, MD.  See CV Proc for preliminary results report.   Darlin Coco, RDMS, RVT

## 2022-07-18 NOTE — Care Management Important Message (Signed)
Important Message  Patient Details  Name: Kevin Carr MRN: [id number] Date of Birth: 10/27/1926   Medicare Important Message Given:  Yes     Shelda Altes 07/18/2022, 2:51 PM

## 2022-07-18 NOTE — Progress Notes (Addendum)
Physical Therapy Treatment Patient Details Name: Kevin Carr MRN: [id number] DOB: 01-Jan-1926 Today's Date: 07/18/2022   History of Present Illness 88 y.o. male who presented to the ED 07/13/2022 from [facility], chest x-ray reveals bilateral lower lobe pneumonia and sepsis with acute renal failure. Has been wheelchair bound since 8/23 hospitalization.  PMH significant for remote prostate cancer, hypothyroidism, GERD, hiatal hernia with inverted stomach    PT Comments    Pt asleep in bed on 2L O2 via [place] SpO2 in mid 80s. Ultimately needs 4L O2 via [place] for mobility and 3L O2 sitting in recliner to maintain SpO2 >90%O2. Pt with decreased command follow and requiring increased assistance, modA for bed mobility, and mod-maxAx2 for transfers. Pt unable to ambulate due to increased bilateral knee buckling. D/c plans remain appropriate at this time. PT will continue to follow acutely.    Recommendations for follow up therapy are one component of a multi-disciplinary discharge planning process, led by the attending physician.  Recommendations may be updated based on patient status, additional functional criteria and insurance authorization.  Follow Up Recommendations  Skilled nursing-short term rehab (<3 hours/day) (return to [employer]) Can patient physically be transported by private vehicle: No   Assistance Recommended at Discharge Frequent or constant Supervision/Assistance  Patient can return home with the following Two people to help with walking and/or transfers;Two people to help with bathing/dressing/bathroom;Assistance with cooking/housework;Assistance with feeding;Direct supervision/assist for medications management;Direct supervision/assist for financial management;Assist for transportation;Help with stairs or ramp for entrance   Equipment Recommendations  None recommended by PT       Precautions / Restrictions Precautions Precautions: Fall Precaution Comments:  catheter Restrictions Weight Bearing Restrictions: No     Mobility  Bed Mobility Overal bed mobility: Needs Assistance Bed Mobility: Supine to Sit     Supine to sit: Mod assist     General bed mobility comments: modA and maximal multimodal cuing to come to EoB. requires pad assist to bring hips to EoB    Transfers Overall transfer level: Needs assistance Equipment used: Rolling walker (2 wheels) Transfers: Sit to/from Stand Sit to Stand: +2 safety/equipment, +2 physical assistance, From elevated surface, Mod assist, Max assist   Step pivot transfers: Max assist, +2 physical assistance, +2 safety/equipment       General transfer comment: continues to prefer to put both hands up on RW and provides little to no initial power up requires increased cuing for getting balance, on first attempt has increased knee buckling requiring him to sit back on bed. on second attempt PT blocking knee, requires maxAx 2 and physical blocking of stance leg for stepping to recliner    Ambulation/Gait               General Gait Details: unable today          Balance Overall balance assessment: Needs assistance Sitting-balance support: Single extremity supported, Bilateral upper extremity supported, No upper extremity supported, Feet supported Sitting balance-Leahy Scale: Fair     Standing balance support: During functional activity, Reliant on assistive device for balance Standing balance-Leahy Scale: Poor Standing balance comment: reliant on RW for support                            Cognition Arousal/Alertness: Awake/alert Behavior During Therapy: Flat affect Overall Cognitive Status: Within Functional Limits for tasks assessed  General Comments: oriented to self and general location, and situation, follows commands with increased time, [facility]        Exercises Other Exercises Other Exercises: IS x5 max inhalation 250  mL    General Comments General comments (skin integrity, edema, etc.): Pt on 2L O2 via [place] sleeping on entry SpO2 85%O2, requires 4L O2 via [address] for stepping to chair able to wean back to 3L while awake, anticipate SpO2 drops again if he falls back asleep      Pertinent Vitals/Pain Pain Assessment Pain Assessment: Faces Faces Pain Scale: Hurts little more Pain Location: generalized with coughing Pain Descriptors / Indicators: Grimacing, Guarding Pain Intervention(s): Limited activity within patient's tolerance, Monitored during session, Repositioned     PT Goals (current goals can now be found in the care plan section) Acute Rehab PT Goals Patient Stated Goal: breathe better PT Goal Formulation: With patient Time For Goal Achievement: 07/30/22 Potential to Achieve Goals: Fair Progress towards PT goals: Not progressing toward goals - comment    Frequency    Min 2X/week      PT Plan Current plan remains appropriate       AM-PAC PT "6 Clicks" Mobility   Outcome Measure  Help needed turning from your back to your side while in a flat bed without using bedrails?: A Little Help needed moving from lying on your back to sitting on the side of a flat bed without using bedrails?: A Little Help needed moving to and from a bed to a chair (including a wheelchair)?: A Little Help needed standing up from a chair using your arms (e.g., wheelchair or bedside chair)?: A Little Help needed to walk in hospital room?: A Little Help needed climbing 3-5 steps with a railing? : Total 6 Click Score: 16    End of Session Equipment Utilized During Treatment: Gait belt Activity Tolerance: Treatment limited secondary to medical complications (Comment) (O2 desaturation) Patient left: in chair;with call bell/phone within reach;with chair alarm set Nurse Communication: Mobility status;Other (comment) (continue to recommend continuous pulse ox due to fluctuations in SpO2 and pt tendency to take [place] out  to blow nose and inability to correctly replace in nares) PT Visit Diagnosis: Other abnormalities of gait and mobility (R26.89);Muscle weakness (generalized) (M62.81);Difficulty in walking, not elsewhere classified (R26.2)     Time: 1610-9604 PT Time Calculation (min) (ACUTE ONLY): 19 min  Charges:  $Therapeutic Activity: 8-22 mins                     Luther Newhouse B. Migdalia Dk PT, DPT Acute Rehabilitation Services Please use secure chat or  Call Office [phone]    [place] 07/18/2022, 4:55 PM

## 2022-07-19 DIAGNOSIS — J9601 Acute respiratory failure with hypoxia: Secondary | ICD-10-CM | POA: Diagnosis not present

## 2022-07-19 DIAGNOSIS — J690 Pneumonitis due to inhalation of food and vomit: Secondary | ICD-10-CM | POA: Diagnosis not present

## 2022-07-19 LAB — CBC
HCT: 25.3 % — ABNORMAL LOW (ref 39.0–52.0)
Hemoglobin: 8.1 g/dL — ABNORMAL LOW (ref 13.0–17.0)
MCH: 29.8 pg (ref 26.0–34.0)
MCHC: 32 g/dL (ref 30.0–36.0)
MCV: 93 fL (ref 80.0–100.0)
Platelets: 266 10*3/uL (ref 150–400)
RBC: 2.72 MIL/uL — ABNORMAL LOW (ref 4.22–5.81)
RDW: 15.3 % (ref 11.5–15.5)
WBC: 12.7 10*3/uL — ABNORMAL HIGH (ref 4.0–10.5)
nRBC: 0 % (ref 0.0–0.2)

## 2022-07-19 LAB — COMPREHENSIVE METABOLIC PANEL
ALT: 10 U/L (ref 0–44)
AST: 13 U/L — ABNORMAL LOW (ref 15–41)
Albumin: 2.6 g/dL — ABNORMAL LOW (ref 3.5–5.0)
Alkaline Phosphatase: 69 U/L (ref 38–126)
Anion gap: 7 (ref 5–15)
BUN: 22 mg/dL (ref 8–23)
CO2: 31 mmol/L (ref 22–32)
Calcium: 8.5 mg/dL — ABNORMAL LOW (ref 8.9–10.3)
Chloride: 102 mmol/L (ref 98–111)
Creatinine, Ser: 1.12 mg/dL (ref 0.61–1.24)
GFR, Estimated: >60 mL/min (ref >60)
Glucose, Bld: 101 mg/dL — ABNORMAL HIGH (ref 70–99)
Potassium: 3.2 mmol/L — ABNORMAL LOW (ref 3.5–5.1)
Sodium: 140 mmol/L (ref 135–145)
Total Bilirubin: 0.5 mg/dL (ref 0.3–1.2)
Total Protein: 5.3 g/dL — ABNORMAL LOW (ref 6.5–8.1)

## 2022-07-19 LAB — CULTURE, BLOOD (ROUTINE X 2)
Culture: NO GROWTH
Special Requests: ADEQUATE

## 2022-07-19 MED ORDER — POTASSIUM CHLORIDE 20 MEQ PO PACK
40.0000 meq | PACK | ORAL | Status: AC
  Administered 2022-07-19 (×2): 40 meq via ORAL
  Filled 2022-07-19 (×2): qty 2

## 2022-07-19 NOTE — Plan of Care (Signed)
  Problem: Education: Goal: Knowledge of General Education information will improve Description: Including pain rating scale, medication(s)/side effects and non-pharmacologic comfort measures Outcome: Completed/Met

## 2022-07-19 NOTE — Progress Notes (Signed)
SLP Cancellation Note  Patient Details Name: LENDELL GALLICK MRN: [id number] DOB: 1926-08-17   Cancelled treatment:       Reason Eval/Treat Not Completed: Fatigue/lethargy limiting ability to participate   Elvina Sidle, M.S., [place] 07/19/2022, 12:32 PM

## 2022-07-19 NOTE — Progress Notes (Signed)
Occupational Therapy Treatment Patient Details Name: Kevin Carr MRN: [id number] DOB: [birth year] Today's Date: 07/19/2022   History of present illness 88 y.o. male who presented to the ED 07/23/2022 from [facility], chest x-ray reveals bilateral lower lobe pneumonia and sepsis with acute renal failure. Has been wheelchair bound since 8/23 hospitalization.  PMH significant for remote prostate cancer, hypothyroidism, GERD, hiatal hernia with inverted stomach   OT comments  Patient received in supine and agreeable to OT session. Patient required frequent cues and mod assist to get to EOB.  Stedy used for transfer to recliner for safety with mod assist to stand.  Patient instructed in grooming tasks seated in recliner with assistance for loading toothbrush and to complete combing hair. Patient demonstrated right lateral leaning in recliner and was positioned to correct. Patient's discharge recommendations continues to be appropriate for SNF for continued OT to increase independence with self care and functional transfers.    Recommendations for follow up therapy are one component of a multi-disciplinary discharge planning process, led by the attending physician.  Recommendations may be updated based on patient status, additional functional criteria and insurance authorization.    Follow Up Recommendations  Skilled nursing-short term rehab (<3 hours/day)    Assistance Recommended at Discharge Frequent or constant Supervision/Assistance  Patient can return home with the following  A lot of help with walking and/or transfers;A lot of help with bathing/dressing/bathroom;Assistance with cooking/housework;Help with stairs or ramp for entrance;Assist for transportation   Equipment Recommendations  None recommended by OT    Recommendations for Other Services      Precautions / Restrictions Precautions Precautions: Fall Precaution Comments: catheter Restrictions Weight Bearing Restrictions: No        Mobility Bed Mobility Overal bed mobility: Needs Assistance Bed Mobility: Supine to Sit     Supine to sit: Mod assist     General bed mobility comments: frequent cues to follow directions for bed mobility and mod assist    Transfers Overall transfer level: Needs assistance Equipment used: Ambulation equipment used Transfers: Sit to/from Stand, Bed to chair/wheelchair/BSC Sit to Stand: Mod assist           General transfer comment: mod assist to stand from EOB using Stedy to pull up. Transfer via Lift Equipment: Stedy   Balance Overall balance assessment: Needs assistance Sitting-balance support: Single extremity supported, Bilateral upper extremity supported, No upper extremity supported, Feet supported Sitting balance-Leahy Scale: Fair Sitting balance - Comments: posterial leaning with use of stedy for balance Postural control: Posterior lean Standing balance support: During functional activity, Reliant on assistive device for balance Standing balance-Leahy Scale: Poor Standing balance comment: reliant on stedy for support                           ADL either performed or assessed with clinical judgement   ADL Overall ADL's : Needs assistance/impaired     Grooming: Wash/dry hands;Wash/dry face;Oral care;Brushing hair;Minimal assistance;Sitting Grooming Details (indicate cue type and reason): in recliner, required assistance to load toothbrush                               General ADL Comments: required cues for sequencing    Extremity/Trunk Assessment Upper Extremity Assessment RUE Coordination: decreased fine motor LUE Coordination: decreased fine motor            Vision       Perception  Praxis      Cognition Arousal/Alertness: Awake/alert Behavior During Therapy: Flat affect Overall Cognitive Status: Within Functional Limits for tasks assessed                                 General Comments:  oriented to self, required cues for sequencing with grooming        Exercises      Shoulder Instructions       General Comments Pt on 3 liters of O2 via [place]. SpO2 92 at rest    Pertinent Vitals/ Pain       Pain Assessment Pain Assessment: Faces Faces Pain Scale: Hurts little more Pain Location: generalized with coughing Pain Descriptors / Indicators: Grimacing, Guarding Pain Intervention(s): Limited activity within patient's tolerance, Monitored during session, Repositioned  Home Living                                          Prior Functioning/Environment              Frequency  Min 2X/week        Progress Toward Goals  OT Goals(current goals can now be found in the care plan section)  Progress towards OT goals: Progressing toward goals  Acute Rehab OT Goals OT Goal Formulation: Patient unable to participate in goal setting Time For Goal Achievement: 07/30/22 Potential to Achieve Goals: Fair ADL Goals Pt Will Perform Upper Body Bathing: sitting;with supervision;with set-up Pt Will Perform Lower Body Dressing: sit to/from stand;with min assist Pt Will Transfer to Toilet: with min guard assist;ambulating;grab bars Pt Will Perform Toileting - Clothing Manipulation and hygiene: with min guard assist;sit to/from stand Pt/caregiver will Perform Home Exercise Program: Increased strength;Both right and left upper extremity;With Supervision Additional ADL Goal #1: Pt will tolerate at least 10 min functional activity at EOB or edge of chair without LOB and with vitals stable as evidence of improved sitting balance and activity tolerance.  Plan Discharge plan remains appropriate    Co-evaluation                 AM-PAC OT "6 Clicks" Daily Activity     Outcome Measure   Help from another person eating meals?: A Little Help from another person taking care of personal grooming?: A Lot Help from another person toileting, which includes using  toliet, bedpan, or urinal?: Total Help from another person bathing (including washing, rinsing, drying)?: A Lot Help from another person to put on and taking off regular upper body clothing?: A Lot Help from another person to put on and taking off regular lower body clothing?: Total 6 Click Score: 11    End of Session Equipment Utilized During Treatment: Other (comment);Oxygen (stedy)  OT Visit Diagnosis: Muscle weakness (generalized) (M62.81);Unsteadiness on feet (R26.81)   Activity Tolerance Patient tolerated treatment well;Patient limited by fatigue   Patient Left in chair;with call bell/phone within reach;with chair alarm set   Nurse Communication Mobility status;Need for lift equipment        Time: 6270-3500 OT Time Calculation (min): 24 min  Charges: OT General Charges $OT Visit: 1 Visit OT Treatments $Self Care/Home Management : 8-22 mins $Therapeutic Activity: 8-22 mins  Lodema Hong, OTA Acute Rehabilitation Services  Office [phone]   Trixie Dredge 07/19/2022, 11:32 AM

## 2022-07-19 NOTE — Progress Notes (Signed)
Progress Note   Patient: Kevin Carr SLH:[id number] DOB: 04-06-1926 DOA: 07/16/2022     5 DOS: the patient was seen and examined on 07/19/2022   Brief hospital course: 88 year old M with PMH of large hiatal hernia, urinary retention/bilateral hydroureteronephrosis with chronic Foley catheter and prostate cancer presenting with abnormal labs including elevated creatinine and chest x-ray concerning for pulmonary edema, and admitted for aspiration pneumonia and AKI.  Family reports overall decline over the last 2 months, mainly over the last 1 week.    In ED, slightly hypotensive to 90/55.  Saturating at 100% on 4 L. Cr 2.92 (baseline 1.0).  BUN 50.  WBC 17.3 with left shift.  Hgb 8.5 (baseline 10-11).  Troponin 481.  BNP 1050.  Lactic acid 0.9.  EKG NSR with LVH but no acute ischemic finding.  UA with large LE and large Hgb with 21-50 RBC per hpf but obtained from chronic Foley.  Foley catheter exchanged in ED.  CXR showed large hiatal hernia, right pleural effusion with associated atelectasis/airspace disease and left basilar atelectasis/airspace disease.  Cultures obtained.  CT chest ordered.  Patient was started on IV cefepime and admitted.   CT chest with progressive RML and LLL consolidation, bilateral pleural effusion, large hiatal hernia with the stomach and portion of transverse colon within the chest.  IR consulted for thoracocentesis but did not appreciate enough fluid for intervention.  Renal US with moderate left hydronephrosis and 13 mm nonobstructing calculus in left lower pole.  AKI resolving.  Remains on IV cefepime and Flagyl for aspiration pneumonia.  Started IV Lasix with albumin for volume overload.  TTE pending.  Now with hematuria.  Urology consulted  During further workup, pt was found to have extensive BLE DVT.  Assessment and Plan: Acute respiratory failure with hypoxia likely due to aspiration pneumonia, pleural effusion, atelectasis and possible acute unknown type CHF.    CXR with significant right pleural effusion and RLL opacity. CT chest with progressive RML and LLL consolidation, bilateral pleural effusion, large hiatal hernia with the stomach and portion of transverse colon within the chest. Given patient's known large hiatal hernia, significant concern for aspiration pneumonia.  Pro-Cal elevated but improving.  Pleural effusion, elevated BNP and troponin initially raised concern for acute CHF.  Low suspicion for PE. -Continued on IV cefepime and Flagyl -Wean oxygen as able, incentive telemetry, OOB/PT/OT -IR consulted for thoracocentesis but not enough fluid by bedside ultrasound -Continued on IV Lasix with IV albumin -2d echo reviewed. Findings of normal LVEF. RV function is normal -Trend leukocytosis and procalcitonin. Procal has been trending down -Aspiration precaution and SLP eval -Patient has not met sepsis criteria yet.  Sepsis ruled out.   Right pleural effusion-unclear if this is CHF related or parapneumonic. -IR consulted but did not appreciate enough fluid by bedside US. -Continued on IV Lasix with albumin as above.   Gross hematuria/pyuria-traumatic from Foley replacement?  UA with pyuria and hematuria but culture negative. BPH/chronic urinary retention with chronic Foley. Moderate left hydronephrosis/nephrolithiasis: on renal US along with 13 mm nonobstructing calculus in left lower pole.  -CT abd reviewed. Findings notable for large bladder mass with continued hydronephrosis. -Urology was consulted.  -Continue home Proscar and Foley catheter. Urology recommendations against surgical managment -UTI ruled out.  -See below. Findings of extensive BLE DVT burden. Discussed with Urology, does not recommend anticoagulation in light of hematuria   AKI/azotemia: Likely prerenal from poor p.o. intake.  Seems he is also on diuretics and ibuprofen.  Renal  US with moderate hydronephrosis.  -Renal function stable. Recheck bmet in AM   Acute blood loss  anemia the setting of gross hematuria -Continue monitoring -Discontinue subcu heparin   BLE edema, left > right secondary to BLE DVT -LE dopplers reviewed, findings notable for extensive BLE DVT -Given hematuria, not candidate for anticoagulation -Will discuss with family. Unclear if pt would be ideal candidate for IVC filter. Will discuss with family. -Will f/u with Palliative Care   Elevated troponin: Patient without chest pain.  EKG without acute finding.  Suspect demand ischemia from hypoxia and infection as well as delayed clearance from AKI.  No significant delta either. -2d echo per above   Large hiatal hernia:  -Aspiration precaution and SLP eval   Generalized weakness/physical deconditioning -PT/OT   Goal of care counseling: Remains full code.  See goals of care discussion on 9/17. -Palliative medicine consulted   Body mass index is 23.89 kg/m.       Subjective: No complaints this AM  Physical Exam: Vitals:   07/18/22 1618 07/18/22 2129 07/19/22 0502 07/19/22 0900  BP: (!) 132/57 (!) 100/52 (!) 102/56 (!) 109/56  Pulse: 71 63 85 70  Resp: $Remo'17 18 18 16  'HphVJ$ Temp: (!) 97.5 F (36.4 C) (!) 97.5 F (36.4 C) 97.7 F (36.5 C) 98.6 F (37 C)  TempSrc: Oral Oral Oral Temporal  SpO2: 97% 100% 98% 98%  Weight:   75.5 kg   Height:       General exam: Conversant, in no acute distress Respiratory system: normal chest rise, clear, no audible wheezing Cardiovascular system: regular rhythm, s1-s2 Gastrointestinal system: Nondistended, nontender, pos BS Central nervous system: No seizures, no tremors Extremities: No cyanosis, no joint deformities Skin: No rashes, no pallor Psychiatry: Affect normal // no auditory hallucinations   Data Reviewed:  Labs reviewed: Na 140, K 3.2, Cr 1.12, Hgb 8.1   Family Communication: Pt in room, family not at bedside currently  Disposition: Status is: Inpatient Remains inpatient appropriate because: Severity of illness  Planned  Discharge Destination: Home     Author: Marylu Lund, MD 07/19/2022 1:00 PM  For on call review [email or web address].

## 2022-07-19 NOTE — Patient Care Conference (Signed)
Called and updated patient's son over phone. All questions answered.

## 2022-07-20 ENCOUNTER — Inpatient Hospital Stay (HOSPITAL_COMMUNITY): Payer: Medicare Other

## 2022-07-20 DIAGNOSIS — R778 Other specified abnormalities of plasma proteins: Secondary | ICD-10-CM | POA: Diagnosis not present

## 2022-07-20 DIAGNOSIS — N179 Acute kidney failure, unspecified: Secondary | ICD-10-CM | POA: Diagnosis not present

## 2022-07-20 HISTORY — PX: IR IVC FILTER PLMT / S&I /IMG GUID/MOD SED: IMG701

## 2022-07-20 LAB — COMPREHENSIVE METABOLIC PANEL
ALT: 11 U/L (ref 0–44)
AST: 16 U/L (ref 15–41)
Albumin: 2.8 g/dL — ABNORMAL LOW (ref 3.5–5.0)
Alkaline Phosphatase: 71 U/L (ref 38–126)
Anion gap: 8 (ref 5–15)
BUN: 20 mg/dL (ref 8–23)
CO2: 32 mmol/L (ref 22–32)
Calcium: 8.8 mg/dL — ABNORMAL LOW (ref 8.9–10.3)
Chloride: 102 mmol/L (ref 98–111)
Creatinine, Ser: 1.13 mg/dL (ref 0.61–1.24)
GFR, Estimated: >60 mL/min (ref >60)
Glucose, Bld: 104 mg/dL — ABNORMAL HIGH (ref 70–99)
Potassium: 3.8 mmol/L (ref 3.5–5.1)
Sodium: 142 mmol/L (ref 135–145)
Total Bilirubin: 0.9 mg/dL (ref 0.3–1.2)
Total Protein: 5.5 g/dL — ABNORMAL LOW (ref 6.5–8.1)

## 2022-07-20 LAB — CBC
HCT: 25.7 % — ABNORMAL LOW (ref 39.0–52.0)
Hemoglobin: 8.3 g/dL — ABNORMAL LOW (ref 13.0–17.0)
MCH: 29.7 pg (ref 26.0–34.0)
MCHC: 32.3 g/dL (ref 30.0–36.0)
MCV: 92.1 fL (ref 80.0–100.0)
Platelets: 259 10*3/uL (ref 150–400)
RBC: 2.79 MIL/uL — ABNORMAL LOW (ref 4.22–5.81)
RDW: 15.5 % (ref 11.5–15.5)
WBC: 15.3 10*3/uL — ABNORMAL HIGH (ref 4.0–10.5)
nRBC: 0 % (ref 0.0–0.2)

## 2022-07-20 LAB — CULTURE, BLOOD (ROUTINE X 2)
Culture: NO GROWTH
Special Requests: ADEQUATE

## 2022-07-20 MED ORDER — IOHEXOL 300 MG/ML  SOLN
150.0000 mL | Freq: Once | INTRAMUSCULAR | Status: AC | PRN
  Administered 2022-07-20: 45 mL via INTRAVENOUS

## 2022-07-20 MED ORDER — LIDOCAINE HCL 1 % IJ SOLN
INTRAMUSCULAR | Status: AC
  Administered 2022-07-20: 8 mL
  Filled 2022-07-20: qty 20

## 2022-07-20 NOTE — Progress Notes (Signed)
Progress Note   Patient: Kevin Carr EHM:[id number] DOB: June 06, 1926 DOA: 07/10/2022     6 DOS: the patient was seen and examined on 07/20/2022   Brief hospital course: 88 year old M with PMH of large hiatal hernia, urinary retention/bilateral hydroureteronephrosis with chronic Foley catheter and prostate cancer presenting with abnormal labs including elevated creatinine and chest x-ray concerning for pulmonary edema, and admitted for aspiration pneumonia and AKI.  Family reports overall decline over the last 2 months, mainly over the last 1 week.    In ED, slightly hypotensive to 90/55.  Saturating at 100% on 4 L. Cr 2.92 (baseline 1.0).  BUN 50.  WBC 17.3 with left shift.  Hgb 8.5 (baseline 10-11).  Troponin 481.  BNP 1050.  Lactic acid 0.9.  EKG NSR with LVH but no acute ischemic finding.  UA with large LE and large Hgb with 21-50 RBC per hpf but obtained from chronic Foley.  Foley catheter exchanged in ED.  CXR showed large hiatal hernia, right pleural effusion with associated atelectasis/airspace disease and left basilar atelectasis/airspace disease.  Cultures obtained.  CT chest ordered.  Patient was started on IV cefepime and admitted.   CT chest with progressive RML and LLL consolidation, bilateral pleural effusion, large hiatal hernia with the stomach and portion of transverse colon within the chest.  IR consulted for thoracocentesis but did not appreciate enough fluid for intervention.  Renal US with moderate left hydronephrosis and 13 mm nonobstructing calculus in left lower pole.  AKI resolving.  Remains on IV cefepime and Flagyl for aspiration pneumonia.  Started IV Lasix with albumin for volume overload.  TTE pending.  Now with hematuria.  Urology consulted  During further workup, pt was found to have extensive BLE DVT.  Assessment and Plan: Acute respiratory failure with hypoxia likely due to aspiration pneumonia, pleural effusion, atelectasis and possible acute unknown type CHF.    CXR with significant right pleural effusion and RLL opacity. CT chest with progressive RML and LLL consolidation, bilateral pleural effusion, large hiatal hernia with the stomach and portion of transverse colon within the chest. Given patient's known large hiatal hernia, significant concern for aspiration pneumonia.  Pro-Cal elevated but improving.  Pleural effusion, elevated BNP and troponin initially raised concern for acute CHF.  Low suspicion for PE. -Continued on IV cefepime and Flagyl -Wean oxygen as able, incentive telemetry, OOB/PT/OT -IR consulted for thoracocentesis but not enough fluid by bedside ultrasound -Continued on IV Lasix with IV albumin -2d echo reviewed. Findings of normal LVEF. RV function is normal -Trend leukocytosis and procalcitonin. Procal has been trending down -Aspiration precaution and SLP eval -Patient has not met sepsis criteria yet.  Sepsis ruled out.   Right pleural effusion-unclear if this is CHF related or parapneumonic. -IR consulted but did not appreciate enough fluid by bedside US. -Continued on IV Lasix with albumin as above.   Gross hematuria/pyuria-traumatic from Foley replacement?  UA with pyuria and hematuria but culture negative. BPH/chronic urinary retention with chronic Foley. Moderate left hydronephrosis/nephrolithiasis: on renal US along with 13 mm nonobstructing calculus in left lower pole.  -CT abd reviewed. Findings notable for large bladder mass with continued hydronephrosis. -Urology was consulted.  -Continue home Proscar and Foley catheter. Urology recommendations against surgical managment -UTI ruled out.  -See below. Findings of extensive BLE DVT burden. Recently discussed with Urology, does not recommend anticoagulation in light of hematuria   AKI/azotemia: Likely prerenal from poor p.o. intake.  Seems he is also on diuretics and ibuprofen.  Renal US with moderate hydronephrosis.  -Renal function stable. Recheck bmet in AM   Acute  blood loss anemia the setting of gross hematuria -Continue monitoring -Discontinue subcu heparin   BLE edema, left > right secondary to BLE DVT -LE dopplers reviewed, findings notable for extensive BLE DVT -Given hematuria, not candidate for anticoagulation -Discussed with family and consulted IR. Family had agreed to IVC filter placement. Pt now s/p filter placement 9/22 -Will f/u with Palliative Care   Elevated troponin: Patient without chest pain.  EKG without acute finding.  Suspect demand ischemia from hypoxia and infection as well as delayed clearance from AKI.  No significant delta either. -2d echo per above   Large hiatal hernia:  -Aspiration precaution and SLP eval   Generalized weakness/physical deconditioning -PT/OT   Goal of care counseling: Remains full code.  See goals of care discussion on 9/17. -Palliative medicine consulted   Body mass index is 23.89 kg/m.       Subjective: Pleasantly confused this AM  Physical Exam: Vitals:   07/20/22 0856 07/20/22 1143 07/20/22 1210 07/20/22 1301  BP: (!) 128/58 116/71 107/71 129/69  Pulse: 77 77 83 88  Resp: _0 Temp: (!) 97.5 F (36.4 C)   97.7 F (36.5 C)  TempSrc: Oral   Oral  SpO2: 98% 96% 97% 99%  Weight:      Height:       General exam: Awake, laying in bed, in nad Respiratory system: Normal respiratory effort, no wheezing Cardiovascular system: regular rate, s1, s2 Gastrointestinal system: Soft, nondistended, positive BS Central nervous system: CN2-12 grossly intact, strength intact Extremities: Perfused, no clubbing Skin: Normal skin turgor, patch of erythema in groin and inner thighs without rash Psychiatry: Mood normal // no visual hallucinations   Data Reviewed:  Labs reviewed: Na 142, K 3.8, Cr 1.13, Hgb 8.3   Family Communication: Pt in room, family not at bedside currently  Disposition: Status is: Inpatient Remains inpatient appropriate because: Severity of illness  Planned  Discharge Destination: Home     Author: Marylu Lund, MD 07/20/2022 4:25 PM  For on call review [email or web address].

## 2022-07-20 NOTE — Consult Note (Signed)
Chief Complaint: Patient was seen in consultation today for bilateral DVTs at the request of Dr Wyline Copas.  Supervising Physician: Ruthann Cancer  Patient Status: [facility] - In-pt  History of Present Illness: Kevin Carr is a 88 y.o. male with pmh of large hiatal hernia, urinary retention, bilateral hydroureteronephrosis with chronic foley catheter, and prostate cancer.  His hospitalization has included treatment for pulmonary edema, aspiration pneumonia, and AKI.  Now, patient has hematuria.  Additionally, further work up has revealed extensive BLE DVT.  Due to the hematuria, anticoagulation not feasible at this time and IR consulted for IVC filter placement  Past Medical History:  Diagnosis Date   Cancer [facility])    prostate   GERD (gastroesophageal reflux disease)    GI bleed    Hiatal hernia    Hypercholesteremia    Osteoporosis    Thyroid disease     Past Surgical History:  Procedure Laterality Date   APPENDECTOMY     TONSILLECTOMY      Allergies: Patient has no known allergies.  Medications: Prior to Admission medications   Medication Sig Start Date End Date Taking? Authorizing Provider  acetaminophen (TYLENOL) 500 MG tablet Take 500 mg by mouth 3 (three) times daily.   Yes [provider]  aspirin 81 MG tablet Take 81 mg by mouth daily.   Yes [provider]  DELSYM 30 MG/5ML liquid Take 60 mg by mouth every 12 (twelve) hours as needed for cough.   Yes [provider]  finasteride (PROSCAR) 5 MG tablet Take 5 mg by mouth daily.   Yes [provider]  furosemide (LASIX) 10 MG/ML injection Inject 15 mg into the muscle 3 (three) times daily. 07/07/2022 07/17/22 Yes [provider]  ibuprofen (ADVIL) 200 MG tablet Take 400 mg by mouth every 6 (six) hours as needed (for pain).   Yes [provider]  levothyroxine (SYNTHROID, LEVOTHROID) 112 MCG tablet Take 112 mcg by mouth daily before breakfast.   Yes [provider]   melatonin 5 MG TABS Take 5 mg by mouth at bedtime.   Yes [provider]  Multiple Vitamin-Folic Acid TABS Take 1 tablet by mouth daily with breakfast.   Yes [provider]  omeprazole (PRILOSEC) 20 MG capsule Take 20 mg by mouth daily before breakfast.   Yes [provider]  polyethylene glycol (MIRALAX / GLYCOLAX) 17 g packet Take 17 g by mouth daily as needed for mild constipation. Patient taking differently: Take 17 g by mouth daily as needed for mild constipation (mixed into 6 ounces of fluid). 06/05/22  Yes [place] Donnamarie Poag, DO  Tamsulosin HCl (FLOMAX) 0.4 MG CAPS Take 1 capsule (0.4 mg total) by mouth daily. 10/27/12  Yes Drenda Freeze, MD  traMADol (ULTRAM) 50 MG tablet Take 25 mg by mouth 2 (two) times daily as needed (for pain).   Yes [provider]  traMADol-acetaminophen (ULTRACET) 37.5-325 MG tablet Take 0.5 tablets by mouth 3 (three) times daily.   Yes [provider]  senna-docusate (SENOKOT-S) 8.6-50 MG tablet Take 1 tablet by mouth 2 (two) times daily. Patient not taking: Reported on 07/02/2022 06/05/22   [place] Eric J, DO     History reviewed. No pertinent family history.  Social History   Socioeconomic History   Marital status: Widowed    Spouse name: Not on file   Number of children: Not on file   Years of education: Not on file   Highest education level: Not on file  Occupational History   Not on file  Tobacco Use   Smoking status: Never   Smokeless tobacco: Never  Vaping Use   Vaping Use: Never used  Substance and Sexual Activity   Alcohol use: No   Drug use: No   Sexual activity: Not on file  Other Topics Concern   Not on file  Social History Narrative   Not on file   Social Determinants of Health   Financial Resource Strain: Not on file  Food Insecurity: No Food Insecurity (07/15/2022)   Hunger Vital Sign    Worried About Running Out of Food in the Last Year: Never true    Ran Out of Food in the Last  Year: Never true  Transportation Needs: No Transportation Needs (07/15/2022)   PRAPARE - Hydrologist (Medical): No    Lack of Transportation (Non-Medical): No  Physical Activity: Not on file  Stress: Not on file  Social Connections: Not on file    Review of Systems: Negative, though not reliable, due to patient clinical status  Vital Signs: BP (!) 128/58   Pulse 77   Temp (!) 97.5 F (36.4 C) (Oral)   Resp 17   Ht '5\' 11"'$  (1.803 m)   Wt 166 lb 7.2 oz (75.5 kg)   SpO2 98%   BMI 23.21 kg/m   Physical Exam HENT:     Head: Normocephalic and atraumatic.     Mouth/Throat:     Mouth: Mucous membranes are moist.     Pharynx: Oropharynx is clear.  Eyes:     Extraocular Movements: Extraocular movements intact.     Conjunctiva/sclera: Conjunctivae normal.  Cardiovascular:     Rate and Rhythm: Normal rate.     Pulses: Normal pulses.  Pulmonary:     Effort: Pulmonary effort is normal. No respiratory distress.     Comments: On supplemental O2 via [place] Abdominal:     General: Abdomen is flat.     Palpations: Abdomen is soft.  Skin:    General: Skin is dry.     Coloration: Skin is pale.  Neurological:     Mental Status: He is alert. He is confused.     Comments: Oriented to self only.  Psychiatric:        Behavior: Behavior is cooperative.     Imaging: VAS [place] LOWER EXTREMITY VENOUS (DVT)  Result Date: 07/18/2022  Lower Venous DVT Study Patient Name:  Kevin Carr  Date of Exam:   07/18/2022 Medical Rec #: [id number]        Accession #:    [id number] Date of Birth: 04/18/1926        Patient Gender: M Patient Age:   41 years Exam Location:  [facility] Procedure:      VAS [place] LOWER EXTREMITY VENOUS (DVT) Referring Phys: Bretta Bang GONFA --------------------------------------------------------------------------------  Indications: Bilateral swelling, erythema, and tenderness. LT>RT.  Limitations: Poor ultrasound/tissue interface and patient  pain/sensitivity to probe. Comparison Study: No prior studies available for comparison. Performing Technologist: Darlin Coco RDMS, RVT  Examination Guidelines: A complete evaluation includes B-mode imaging, spectral Doppler, color Doppler, and power Doppler as needed of all accessible portions of each vessel. Bilateral testing is considered an integral part of a complete examination. Limited examinations for reoccurring indications may be performed as noted. The reflux portion of the exam is performed with the patient in reverse Trendelenburg.  +---------+---------------+---------+-----------+----------+-------------------+ RIGHT    CompressibilityPhasicitySpontaneityPropertiesThrombus Aging      +---------+---------------+---------+-----------+----------+-------------------+ CFV  Partial        Yes      Yes                  Acute               +---------+---------------+---------+-----------+----------+-------------------+ SFJ      Partial                                      Acute               +---------+---------------+---------+-----------+----------+-------------------+ FV Prox  None                                         Acute               +---------+---------------+---------+-----------+----------+-------------------+ FV Mid   Partial                                      Acute               +---------+---------------+---------+-----------+----------+-------------------+ FV DistalPartial                                      Acute               +---------+---------------+---------+-----------+----------+-------------------+ PFV      None                                         Age Indeterminate   +---------+---------------+---------+-----------+----------+-------------------+ POP      Partial                                      Acute               +---------+---------------+---------+-----------+----------+-------------------+ PTV                                                    Not well visualized +---------+---------------+---------+-----------+----------+-------------------+ PERO                                                  Not well visualized +---------+---------------+---------+-----------+----------+-------------------+   +---------+---------------+---------+-----------+----------+-------------------+ LEFT     CompressibilityPhasicitySpontaneityPropertiesThrombus Aging      +---------+---------------+---------+-----------+----------+-------------------+ CFV      None           No       No                   Acute               +---------+---------------+---------+-----------+----------+-------------------+ SFJ      None           No  No                   Acute               +---------+---------------+---------+-----------+----------+-------------------+ FV Prox  Partial        Yes      Yes                  Age Indeterminate   +---------+---------------+---------+-----------+----------+-------------------+ FV Mid   None           No       No                   Age Indeterminate   +---------+---------------+---------+-----------+----------+-------------------+ FV DistalNone           Yes      Yes                  Age Indeterminate   +---------+---------------+---------+-----------+----------+-------------------+ PFV      None           No       No                   Age Indeterminate   +---------+---------------+---------+-----------+----------+-------------------+ POP      Partial        Yes      Yes                  Age Indeterminate   +---------+---------------+---------+-----------+----------+-------------------+ PTV                                                   Not well visualized +---------+---------------+---------+-----------+----------+-------------------+ PERO                                                  Not well visualized  +---------+---------------+---------+-----------+----------+-------------------+     Summary: RIGHT: - Findings consistent with acute deep vein thrombosis involving the right common femoral vein, SF junction, right femoral vein, and right proximal profunda vein. - Findings consistent with age indeterminate deep vein thrombosis involving the right proximal profunda vein. - No cystic structure found in the popliteal fossa. - Unable to evaluate extension of common femoral vein obstruction proximal to the inguinal ligament.  LEFT: - Findings consistent with acute deep vein thrombosis involving the left common femoral vein, and SF junction. - Findings consistent with age indeterminate deep vein thrombosis involving the left femoral vein, left popliteal vein, and left proximal profunda vein. - No cystic structure found in the popliteal fossa. - Unable to evaluate extension of common femoral vein obstruction proximal to the inguinal ligament.  *See table(s) above for measurements and observations. Electronically signed by Jamelle Haring on 07/18/2022 at 4:25:50 PM.    Final    ECHOCARDIOGRAM COMPLETE  Result Date: 07/18/2022    ECHOCARDIOGRAM REPORT   Patient Name:   Kevin Carr Date of Exam: 07/18/2022 Medical Rec #:  [id number]       Height:       71.0 in Accession #:    [id number]      Weight:       171.3 lb Date of Birth:  Nov 19, 1925       BSA:  1.974 m Patient Age:    79 years        BP:           121/54 mmHg Patient Gender: M               HR:           56 bpm. Exam Location:  Inpatient Procedure: 2D Echo, Cardiac Doppler and Color Doppler Indications:    Dyspnea  History:        Patient has no prior history of Echocardiogram examinations.                 Risk Factors:Dyslipidemia.  Sonographer:    Memory Argue Referring Phys: [place]  1. Left ventricular ejection fraction, by estimation, is 55 to 60%. The left ventricle has normal function. The left ventricle demonstrates regional  wall motion abnormalities (see scoring diagram/findings for description). Left ventricular diastolic parameters are indeterminate. There is mild hypokinesis of the left ventricular, basal-mid inferoseptal wall, inferior wall and inferolateral wall.  2. Right ventricular systolic function is normal. The right ventricular size is normal.  3. Left atrial size was mildly dilated.  4. The mitral valve is normal in structure. Trivial mitral valve regurgitation. No evidence of mitral stenosis.  5. The aortic valve is tricuspid. There is mild calcification of the aortic valve. There is mild thickening of the aortic valve. Aortic valve regurgitation is not visualized. Aortic valve sclerosis/calcification is present, without any evidence of aortic stenosis. Comparison(s): No prior Echocardiogram. Conclusion(s)/Recommendation(s): Normal LVEF with very mild wall motion abnormalities as noted. FINDINGS  Left Ventricle: Left ventricular ejection fraction, by estimation, is 55 to 60%. The left ventricle has normal function. The left ventricle demonstrates regional wall motion abnormalities. Mild hypokinesis of the left ventricular, basal-mid inferoseptal  wall, inferior wall and inferolateral wall. The left ventricular internal cavity size was normal in size. There is no left ventricular hypertrophy. Left ventricular diastolic parameters are indeterminate. Right Ventricle: The right ventricular size is normal. Right vetricular wall thickness was not well visualized. Right ventricular systolic function is normal. Left Atrium: Left atrial size was mildly dilated. Right Atrium: Right atrial size was normal in size. Pericardium: There is no evidence of pericardial effusion. Mitral Valve: The mitral valve is normal in structure. Trivial mitral valve regurgitation. No evidence of mitral valve stenosis. Tricuspid Valve: The tricuspid valve is normal in structure. Tricuspid valve regurgitation is trivial. No evidence of tricuspid  stenosis. Aortic Valve: The aortic valve is tricuspid. There is mild calcification of the aortic valve. There is mild thickening of the aortic valve. Aortic valve regurgitation is not visualized. Aortic valve sclerosis/calcification is present, without any evidence of aortic stenosis. Aortic valve mean gradient measures 5.0 mmHg. Aortic valve peak gradient measures 9.2 mmHg. Aortic valve area, by VTI measures 2.42 cm. Pulmonic Valve: The pulmonic valve was not well visualized. Pulmonic valve regurgitation is not visualized. No evidence of pulmonic stenosis. Aorta: The aortic root and ascending aorta are structurally normal, with no evidence of dilitation and the aortic arch was not well visualized. Venous: The inferior vena cava was not well visualized. IAS/Shunts: The interatrial septum was not well visualized.  LEFT VENTRICLE PLAX 2D LVIDd:         4.00 cm   Diastology LVIDs:         2.60 cm   LV e' medial:    6.64 cm/s LV PW:         0.80 cm  LV E/e' medial:  10.0 LV IVS:        0.90 cm   LV e' lateral:   10.60 cm/s LVOT diam:     2.20 cm   LV E/e' lateral: 6.3 LV SV:         76 LV SV Index:   39 LVOT Area:     3.80 cm  RIGHT VENTRICLE TAPSE (M-mode): 1.8 cm LEFT ATRIUM             Index        RIGHT ATRIUM           Index LA diam:        2.50 cm 1.27 cm/m   RA Area:     14.90 cm LA Vol (A2C):   45.1 ml 22.84 ml/m  RA Volume:   33.20 ml  16.82 ml/m LA Vol (A4C):   61.7 ml 31.25 ml/m LA Biplane Vol: 53.4 ml 27.05 ml/m  AORTIC VALVE AV Area (Vmax):    2.65 cm AV Area (Vmean):   2.60 cm AV Area (VTI):     2.42 cm AV Vmax:           152.00 cm/s AV Vmean:          100.000 cm/s AV VTI:            0.316 m AV Peak Grad:      9.2 mmHg AV Mean Grad:      5.0 mmHg LVOT Vmax:         106.00 cm/s LVOT Vmean:        68.300 cm/s LVOT VTI:          0.201 m LVOT/AV VTI ratio: 0.64  AORTA Ao Root diam: 3.90 cm Ao Asc diam:  3.40 cm MITRAL VALVE                TRICUSPID VALVE MV Area (PHT): 2.83 cm     TR Peak grad:    23.0 mmHg MV Decel Time: 268 msec     TR Vmax:        240.00 cm/s MV E velocity: 66.30 cm/s MV A velocity: 109.00 cm/s  SHUNTS MV E/A ratio:  0.61         Systemic VTI:  0.20 m                             Systemic Diam: 2.20 cm Buford Dresser MD Electronically signed by Buford Dresser MD Signature Date/Time: 07/18/2022/1:31:56 PM    Final    DG Chest 2 View  Result Date: 07/18/2022 CLINICAL DATA:  [id number]. Follow-up pleural effusions, or infiltrates. EXAM: CHEST - 2 VIEW COMPARISON:  Portable chest 07/27/2022, chest CT 07/15/2022 FINDINGS: The heart is enlarged. There is perihilar vascular congestion with increased interstitial edema throughout the lung fields. Small pleural effusions appear similar. There is increased patchy consolidation in the right mid and lower lung field, consolidation or atelectasis alongside the chronically elevated left diaphragm with large hiatal hernia with intrathoracic stomach and splenic flexure again shown. There is no pneumothorax. There is aortic tortuosity and calcification with stable mediastinum. There is osteopenia with degenerative change of the spine. IMPRESSION: 1. Increased interstitial edema throughout the lungs. 2. Similar small pleural effusions with increasing patchy consolidation in the right mid and lower lung field with consolidation or atelectasis along the elevated left diaphragm. 3. Large hiatal hernia with intrathoracic stomach. 4. Aortic atherosclerosis. Electronically Signed  By: Telford Nab M.D.   On: 07/18/2022 06:23   CT ABDOMEN PELVIS WO CONTRAST  Result Date: 07/17/2022 CLINICAL DATA:  Hematuria EXAM: CT ABDOMEN AND PELVIS WITHOUT CONTRAST TECHNIQUE: Multidetector CT imaging of the abdomen and pelvis was performed following the standard protocol without IV contrast. RADIATION DOSE REDUCTION: This exam was performed according to the departmental dose-optimization program which includes automated exposure control, adjustment of the mA  and/or kV according to patient size and/or use of iterative reconstruction technique. COMPARISON:  CT 05/30/2022 FINDINGS: Lower chest: Trace bilateral pleural effusions with bibasilar atelectasis. There is a degree of ground-glass opacity and interlobular septal thickening within the right perihilar and right basilar region. Large hiatal hernia with atelectasis adjacent to the hernia sac. Aortic and coronary artery atherosclerosis. Hepatobiliary: Unremarkable unenhanced appearance of the liver. No focal liver lesion identified. Gallbladder within normal limits. No hyperdense gallstone. No biliary dilatation. Pancreas: Unremarkable. No pancreatic ductal dilatation or surrounding inflammatory changes. Spleen: Normal in size without focal abnormality. Adrenals/Urinary Tract: Unremarkable adrenal glands. Moderate-severe left hydroureteronephrosis similar to the previous CT. Resolved right hydronephrosis with mild right hydroureter, improved. Stable bilateral renal calculi. Urinary bladder is decompressed by Foley catheter. Abnormal lobulated soft tissue density surrounding the Foley catheter balloon compatible with bladder wall mass seen on previous CT. Calcifications within the posterior aspect of the bladder likely a combination of bladder stones/debris as well as partially calcified bladder wall mass. Stomach/Bowel: Large hiatal hernia with intrathoracic stomach. Gastric volvulus without evidence of gastric outlet obstruction. No dilated loops of small bowel. Extensive colonic diverticulosis. No focal bowel wall thickening or inflammatory changes. Vascular/Lymphatic: Aortic atherosclerosis. No enlarged abdominal or pelvic lymph nodes. Reproductive: Prostatomegaly. Prostate gland is closely associated with the bladder wall mass, better seen on previous CT. Other: No free fluid. No abdominopelvic fluid collection. No pneumoperitoneum. Small fat containing inguinal hernias, right greater than left. Musculoskeletal:  Compression fractures of the T8 L3, and L4 vertebral bodies are unchanged from prior. Bones are demineralized. No lytic or sclerotic bony lesion is evident. Mild anasarca. IMPRESSION: 1. Urinary bladder decompressed by Foley catheter. Large lobulated bladder wall mass, better seen on previous CT from 05/30/2022. 2. Moderate-severe left hydroureteronephrosis similar to the previous CT. Resolved right hydronephrosis with mild right hydroureter, improved. Stable bilateral renal calculi. 3. Prostatomegaly. Prostate gland is closely associated with the bladder wall mass, better seen on previous CT. 4. Large hiatal hernia with intrathoracic stomach. Gastric volvulus without evidence of gastric outlet obstruction. 5. Trace bilateral pleural effusions with bibasilar atelectasis. There is a degree of ground-glass opacity and interlobular septal thickening within the right perihilar and right basilar region, which could represent mild edema versus an infectious or inflammatory process. 6. Extensive colonic diverticulosis without evidence of acute diverticulitis. 7. Aortic Atherosclerosis (ICD10-I70.0). Electronically Signed   By: Davina Poke D.O.   On: 07/17/2022 15:08   IR US CHEST  Result Date: 07/16/2022 CLINICAL DATA:  Concern for pleural effusion EXAM: CHEST ULTRASOUND COMPARISON:  CT chest previous day FINDINGS: Focused sonographic exam of the chest was performed for fluid assessment. Images demonstrate no appreciable pleural fluid. No thoracentesis performed. IMPRESSION: No appreciable pleural effusion bilaterally. No intervention performed. Electronically Signed   By: Albin Felling M.D.   On: 07/16/2022 13:56   US RENAL  Result Date: 07/15/2022 CLINICAL DATA:  Acute kidney injury EXAM: RENAL / URINARY TRACT ULTRASOUND COMPLETE COMPARISON:  None Available. FINDINGS: Right Kidney: Renal measurements: 10.6 x 5.4 x 5.2 cm = volume: 156 mL. 2  x 3 cm right lower pole renal cyst. Echogenicity within normal  limits. No mass or hydronephrosis visualized. Left Kidney: Renal measurements: 10.00 x 4.8 x 5.0 cm = volume: 123 mL. Moderate hydronephrosis. 13 mm left lower pole calculus. Bladder: Empty bladder with Foley catheter inflated in the bladder. Other: None. IMPRESSION: Negative right kidney Moderate left hydronephrosis. 13 mm nonobstructing calculus left lower pole. Electronically Signed   By: Franchot Gallo M.D.   On: 07/15/2022 13:58   CT CHEST WO CONTRAST  Result Date: 07/15/2022 CLINICAL DATA:  Follow-up pleural effusions EXAM: CT CHEST WITHOUT CONTRAST TECHNIQUE: Multidetector CT imaging of the chest was performed following the standard protocol without IV contrast. RADIATION DOSE REDUCTION: This exam was performed according to the departmental dose-optimization program which includes automated exposure control, adjustment of the mA and/or kV according to patient size and/or use of iterative reconstruction technique. COMPARISON:  Chest x-ray from the previous day, CT from 05/30/2022 FINDINGS: Cardiovascular: Somewhat limited due to lack of IV contrast. Diffuse atherosclerotic calcifications are noted of the aorta. No aneurysmal dilatation or dissection is seen. No cardiac enlargement is noted. Coronary calcifications are seen. Mediastinum/Nodes: Thoracic inlet is within normal limits. No hilar or mediastinal adenopathy is noted. The esophagus is within normal limits. Large hiatal hernia is noted with the majority of the stomach within the left chest cavity as well as a loop of transverse colon. This is stable from the prior exam. Lungs/Pleura: Small right pleural effusion is noted. Right middle lobe consolidation is noted with volume loss new from the prior CT but stable from recent chest x-ray. Moderate-sized left pleural effusion is noted with left lower lobe consolidation. This has progressed in the interval from the prior exam. Some upper lobe atelectasis is noted as well. Upper Abdomen: Visualized upper  abdomen shows no acute abnormality. Large hiatal hernia is again noted. Musculoskeletal: Degenerative changes of the thoracic spine are noted. No rib abnormality is seen. Stable compression deformities involving T6 through T8 as well as T1. IMPRESSION: Right middle and left lower lobe consolidation progressed in the interval from the prior exam. Some left upper lobe atelectasis is noted as well. Bilateral pleural effusions slightly increased from the prior CT. Large hiatal hernia with the stomach and a portion of the transverse colon within the chest Aortic Atherosclerosis (ICD10-I70.0). Electronically Signed   By: Inez Catalina M.D.   On: 07/15/2022 02:01   DG Chest Portable 1 View  Result Date: 07/21/2022 CLINICAL DATA:  Shortness of breath. EXAM: PORTABLE CHEST 1 VIEW COMPARISON:  Chest radiograph and chest CT dated 05/30/2022. FINDINGS: The heart size is normal. Vascular calcifications are seen in the aortic arch. The stomach is again noted in the left thorax. There is a right pleural effusion with associated atelectasis/airspace disease. There is left basilar atelectasis/airspace disease. A left pleural effusion may contribute. There is no pneumothorax. Degenerative changes are seen in the spine. IMPRESSION: 1. Right pleural effusion with associated atelectasis/airspace disease. 2. Left basilar atelectasis/airspace disease and possible left pleural effusion. Aortic Atherosclerosis (ICD10-I70.0). Electronically Signed   By: Zerita Boers M.D.   On: 06/29/2022 18:57    Labs:  CBC: Recent Labs    07/17/22 0242 07/18/22 0537 07/19/22 0223 07/20/22 0157  WBC 17.0* 11.4* 12.7* 15.3*  HGB 8.6* 7.9* 8.1* 8.3*  HCT 27.7* 24.1* 25.3* 25.7*  PLT 279 241 266 259    COAGS: Recent Labs    05/30/22 2213  INR 1.2  APTT 34    BMP: Recent Labs  07/17/22 0242 07/18/22 0537 07/19/22 0223 07/20/22 0157  NA 137 139 140 142  K 3.7 3.3* 3.2* 3.8  CL 103 104 102 102  CO2 '26 29 31 '$ 32  GLUCOSE 98  95 101* 104*  BUN 36* 26* 22 20  CALCIUM 8.5* 8.3* 8.5* 8.8*  CREATININE 1.47* 1.14 1.12 1.13  GFRNONAA 44* 60* >60 >60    LIVER FUNCTION TESTS: Recent Labs    07/18/2022 1813 07/15/22 1531 07/16/22 1044 07/17/22 0242 07/18/22 0537 07/19/22 0223 07/20/22 0157  BILITOT 0.6 0.3  --   --   --  0.5 0.9  AST 18 20  --   --   --  13* 16  ALT 12 9  --   --   --  10 11  ALKPHOS 101 100  --   --   --  69 71  PROT 5.5* 5.5*  --   --   --  5.3* 5.5*  ALBUMIN 2.1* 2.1*   < > 2.5* 2.4* 2.6* 2.8*   < > = values in this interval not displayed.    Assessment and Plan:  Bilateral DVTs --complicated by presence of hematuria, no anticoagulation utilized --IVC filter request reviewed by Dr. Earleen Newport and approved. --Pt is NPO.  Son is on board with proceeding --Will tentatively plan for placement today in IR.  Risks and benefits discussed with the patient and son including, but not limited to bleeding, infection, contrast induced renal failure, filter fracture or migration which can lead to emergency surgery or even death, strut penetration with damage or irritation to adjacent structures and caval thrombosis.  All of the patient's questions were answered, patient is agreeable to proceed. Consent signed and in chart.   Thank you for this interesting consult.  I greatly enjoyed meeting Kevin Carr and look forward to participating in their care.  A copy of this report was sent to the requesting provider on this date.  Electronically Signed: Pasty Spillers, PA 07/20/2022, 9:36 AM   I spent a total of 40 Minutes in face to face in clinical consultation, greater than 50% of which was counseling/coordinating care for IVC filter placement

## 2022-07-20 NOTE — Sedation Documentation (Signed)
No sedation at this time only local will be used.

## 2022-07-20 NOTE — Progress Notes (Signed)
SLP Cancellation Note  Patient Details Name: RICAHRD SCHWAGER MRN: [id number] DOB: 23-Nov-1925   Cancelled treatment:       Reason Eval/Treat Not Completed: Medical issues which prohibited therapy (pt NPO for procedure). Will f/u as able.    Osie Bond., M.A. [place] Office [phone]  Secure chat preferred  07/20/2022, 10:16 AM

## 2022-07-20 NOTE — TOC Progression Note (Signed)
Transition of Care [facility]) - Initial/Assessment Note    Patient Details  Name: Kevin Carr MRN: [id number] Date of Birth: [birth year]  Transition of Care [facility]) CM/SW Contact:    Milinda Antis, [place] Phone Number: 07/20/2022, 4:06 PM  Clinical Narrative:                 TOC following patient for  d/c planning needs once medically stable.    Lind Covert, MSW, LCSWA         Patient Goals and CMS Choice        Expected Discharge Plan and Services                                                Prior Living Arrangements/Services                       Activities of Daily Living Home Assistive Devices/Equipment: Oxygen, Hoyer Lift (pt. from SNF) ADL Screening (condition at time of admission) Patient's cognitive ability adequate to safely complete daily activities?: No Is the patient deaf or have difficulty hearing?: No Does the patient have difficulty seeing, even when wearing glasses/contacts?: Yes Does the patient have difficulty concentrating, remembering, or making decisions?: Yes Patient able to express need for assistance with ADLs?: Yes Does the patient have difficulty dressing or bathing?: Yes Independently performs ADLs?: No Communication: Dependent Is this a change from baseline?: Pre-admission baseline Dressing (OT): Dependent Is this a change from baseline?: Pre-admission baseline Grooming: Dependent Is this a change from baseline?: Pre-admission baseline Feeding: Needs assistance Is this a change from baseline?: Pre-admission baseline Bathing: Dependent Is this a change from baseline?: Pre-admission baseline Toileting: Dependent Is this a change from baseline?: Pre-admission baseline In/Out Bed: Dependent Is this a change from baseline?: Pre-admission baseline Walks in Home: Dependent Is this a change from baseline?: Pre-admission baseline Does the patient have difficulty walking or climbing stairs?: Yes Weakness of Legs:  Both Weakness of Arms/Hands: Both  Permission Sought/Granted                  Emotional Assessment              Admission diagnosis:  Aspiration pneumonia ([place]) [J69.0] AKI (acute kidney injury) ([place]) [N17.9] Aspiration pneumonia of both lungs, unspecified aspiration pneumonia type, unspecified part of lung ([place]) [J69.0] Patient Active Problem List   Diagnosis Date Noted   Gross hematuria 07/17/2022   Goals of care, counseling/discussion 07/15/2022   Elevated troponin 07/15/2022   Aspiration pneumonia ([place]) 07/05/2022   Acute respiratory failure with hypoxia ([place]) 07/12/2022   Wheelchair dependence 07/05/2022   Urinary retention 07/06/2022   AKI (acute kidney injury) ([place]) 05/30/2022   Hypothyroidism 05/30/2022   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

## 2022-07-20 NOTE — Procedures (Signed)
Interventional Radiology Procedure Note  Procedure: IVC filter placement  Findings: Please refer to procedural dictation for full description. Femoral approach, [place] IVC filter placed, infrarenal location.  Complications: None immediate  Estimated Blood Loss: < 5 mL  Recommendations: 2 hour flat bedrest IR will arrange outpatient follow up for possible filter retrieval   Ruthann Cancer, MD

## 2022-07-20 NOTE — Progress Notes (Signed)
Physical Therapy Treatment Patient Details Name: Kevin Carr MRN: [id number] DOB: 01/28/1926 Today's Date: 07/20/2022   History of Present Illness 88 y.o. male who presented to the ED 07/04/2022 from [facility], chest x-ray reveals bilateral lower lobe pneumonia and sepsis with acute renal failure. Has been wheelchair bound since 8/23 hospitalization.  PMH significant for remote prostate cancer, hypothyroidism, GERD, hiatal hernia with inverted stomach    PT Comments    Patient feeling tired today. Initially agreeable to participate in therapy requiring max A to get to EOB with increased time and sequencing. Once EOB, pt declining wanting to practice standing in stedy however finally agreeable to get to chair. Requires Mod-max A of 2 to stand from different surface heights, pending fatigue level. Sp02 remained 100% on 3L/min 02 [place] but 2-3/4 DOE noted with activity. Pt with flexed posture and grimacing with all movement or light touch. Utilized stedy to transfer to chair. Continue to recommend SNF. Will follow.    Recommendations for follow up therapy are one component of a multi-disciplinary discharge planning process, led by the attending physician.  Recommendations may be updated based on patient status, additional functional criteria and insurance authorization.  Follow Up Recommendations  Skilled nursing-short term rehab (<3 hours/day) Can patient physically be transported by private vehicle: No   Assistance Recommended at Discharge Frequent or constant Supervision/Assistance  Patient can return home with the following Two people to help with walking and/or transfers;Two people to help with bathing/dressing/bathroom;Assistance with cooking/housework;Assistance with feeding;Direct supervision/assist for medications management;Direct supervision/assist for financial management;Assist for transportation;Help with stairs or ramp for entrance   Equipment Recommendations  None recommended  by PT    Recommendations for Other Services       Precautions / Restrictions Precautions Precautions: Fall Restrictions Weight Bearing Restrictions: No     Mobility  Bed Mobility Overal bed mobility: Needs Assistance Bed Mobility: Supine to Sit     Supine to sit: Max assist, HOB elevated     General bed mobility comments: assist with LEs, trunk and scooting bottom to EOB, max cues for sequencing with repetition to use rail. Left lateral lean    Transfers Overall transfer level: Needs assistance Equipment used: Ambulation equipment used Transfers: Sit to/from Stand, Bed to chair/wheelchair/BSC Sit to Stand: Mod assist, +2 physical assistance, From elevated surface, Max assist           General transfer comment: Mod A of 2 to power to standing from EOB x1, from stedy seat x3 fatiguing needing Max A of 2 towards end with flexed posture    Ambulation/Gait               General Gait Details: Unable   Stairs             Wheelchair Mobility    Modified Rankin (Stroke Patients Only)       Balance Overall balance assessment: Needs assistance Sitting-balance support: Feet supported, Single extremity supported Sitting balance-Leahy Scale: Fair Sitting balance - Comments: Left lateral lean on elbow as preference, close min gaurd Postural control: Left lateral lean Standing balance support: During functional activity Standing balance-Leahy Scale: Zero Standing balance comment: reliant on stedy for support                            Cognition Arousal/Alertness: Awake/alert Behavior During Therapy: Flat affect Overall Cognitive Status: Difficult to assess  General Comments: Repetition needed and cues for sequencing, slow processing and response time.        Exercises      General Comments General comments (skin integrity, edema, etc.): Sp02 100% on 3L/min 02 [place].      Pertinent  Vitals/Pain Pain Assessment Pain Assessment: Faces Faces Pain Scale: Hurts even more Pain Location: grimacing with movement Pain Descriptors / Indicators: Grimacing, Guarding Pain Intervention(s): Limited activity within patient's tolerance, Monitored during session, Repositioned    Home Living                          Prior Function            PT Goals (current goals can now be found in the care plan section) Progress towards PT goals: Not progressing toward goals - comment (fatigue, SOB)    Frequency    Min 2X/week      PT Plan Current plan remains appropriate    Co-evaluation              AM-PAC PT "6 Clicks" Mobility   Outcome Measure  Help needed turning from your back to your side while in a flat bed without using bedrails?: A Lot Help needed moving from lying on your back to sitting on the side of a flat bed without using bedrails?: A Lot Help needed moving to and from a bed to a chair (including a wheelchair)?: A Lot Help needed standing up from a chair using your arms (e.g., wheelchair or bedside chair)?: Total Help needed to walk in hospital room?: Total Help needed climbing 3-5 steps with a railing? : Total 6 Click Score: 9    End of Session Equipment Utilized During Treatment: Gait belt Activity Tolerance: Patient limited by fatigue;Other (comment) (dyspnea) Patient left: in chair;with call bell/phone within reach;with chair alarm set Nurse Communication: Mobility status;Other (comment) (stedy) PT Visit Diagnosis: Other abnormalities of gait and mobility (R26.89);Muscle weakness (generalized) (M62.81);Difficulty in walking, not elsewhere classified (R26.2)     Time: 2409-7353 PT Time Calculation (min) (ACUTE ONLY): 18 min  Charges:  $Therapeutic Activity: 8-22 mins                     Marisa Severin, PT, DPT Acute Rehabilitation Services Secure chat preferred Office [phone]      Kevin Carr 07/20/2022, 11:17 AM

## 2022-07-21 ENCOUNTER — Inpatient Hospital Stay (HOSPITAL_COMMUNITY): Payer: Medicare Other

## 2022-07-21 DIAGNOSIS — R627 Adult failure to thrive: Secondary | ICD-10-CM

## 2022-07-21 DIAGNOSIS — Z7189 Other specified counseling: Secondary | ICD-10-CM | POA: Diagnosis not present

## 2022-07-21 DIAGNOSIS — Z515 Encounter for palliative care: Secondary | ICD-10-CM | POA: Diagnosis not present

## 2022-07-21 DIAGNOSIS — N179 Acute kidney failure, unspecified: Secondary | ICD-10-CM | POA: Diagnosis not present

## 2022-07-21 DIAGNOSIS — R778 Other specified abnormalities of plasma proteins: Secondary | ICD-10-CM | POA: Diagnosis not present

## 2022-07-21 LAB — CBC
HCT: 27.5 % — ABNORMAL LOW (ref 39.0–52.0)
Hemoglobin: 8.6 g/dL — ABNORMAL LOW (ref 13.0–17.0)
MCH: 29.4 pg (ref 26.0–34.0)
MCHC: 31.3 g/dL (ref 30.0–36.0)
MCV: 93.9 fL (ref 80.0–100.0)
Platelets: 250 10*3/uL (ref 150–400)
RBC: 2.93 MIL/uL — ABNORMAL LOW (ref 4.22–5.81)
RDW: 16.2 % — ABNORMAL HIGH (ref 11.5–15.5)
WBC: 26.4 10*3/uL — ABNORMAL HIGH (ref 4.0–10.5)
nRBC: 0 % (ref 0.0–0.2)

## 2022-07-21 LAB — COMPREHENSIVE METABOLIC PANEL
ALT: 9 U/L (ref 0–44)
AST: 19 U/L (ref 15–41)
Albumin: 3 g/dL — ABNORMAL LOW (ref 3.5–5.0)
Alkaline Phosphatase: 81 U/L (ref 38–126)
Anion gap: 11 (ref 5–15)
BUN: 18 mg/dL (ref 8–23)
CO2: 31 mmol/L (ref 22–32)
Calcium: 8.8 mg/dL — ABNORMAL LOW (ref 8.9–10.3)
Chloride: 100 mmol/L (ref 98–111)
Creatinine, Ser: 1.2 mg/dL (ref 0.61–1.24)
GFR, Estimated: 56 mL/min — ABNORMAL LOW (ref >60)
Glucose, Bld: 64 mg/dL — ABNORMAL LOW (ref 70–99)
Potassium: 3.4 mmol/L — ABNORMAL LOW (ref 3.5–5.1)
Sodium: 142 mmol/L (ref 135–145)
Total Bilirubin: 1 mg/dL (ref 0.3–1.2)
Total Protein: 5.6 g/dL — ABNORMAL LOW (ref 6.5–8.1)

## 2022-07-21 MED ORDER — POTASSIUM CHLORIDE CRYS ER 20 MEQ PO TBCR
40.0000 meq | EXTENDED_RELEASE_TABLET | ORAL | Status: AC
  Filled 2022-07-21: qty 2

## 2022-07-21 NOTE — Progress Notes (Signed)
Slept fairly well last night. Oral hydration encouraged through the shift. Denies pain. Remains alert to self and place. Foley care done last night. IVC filter site clean and not bleeding. VS stable this am. Safety maintained.

## 2022-07-21 NOTE — Progress Notes (Addendum)
Progress Note   Patient: Kevin Carr XTG:[id number] DOB: [birth year] DOA: 07/27/2022     7 DOS: the patient was seen and examined on 07/21/2022   Brief hospital course: 88 year old M with PMH of large hiatal hernia, urinary retention/bilateral hydroureteronephrosis with chronic Foley catheter and prostate cancer presenting with abnormal labs including elevated creatinine and chest x-ray concerning for pulmonary edema, and admitted for aspiration pneumonia and AKI.  Family reports overall decline over the last 2 months, mainly over the last 1 week.    In ED, slightly hypotensive to 90/55.  Saturating at 100% on 4 L. Cr 2.92 (baseline 1.0).  BUN 50.  WBC 17.3 with left shift.  Hgb 8.5 (baseline 10-11).  Troponin 481.  BNP 1050.  Lactic acid 0.9.  EKG NSR with LVH but no acute ischemic finding.  UA with large LE and large Hgb with 21-50 RBC per hpf but obtained from chronic Foley.  Foley catheter exchanged in ED.  CXR showed large hiatal hernia, right pleural effusion with associated atelectasis/airspace disease and left basilar atelectasis/airspace disease.  Cultures obtained.  CT chest ordered.  Patient was started on IV cefepime and admitted.   CT chest with progressive RML and LLL consolidation, bilateral pleural effusion, large hiatal hernia with the stomach and portion of transverse colon within the chest.  IR consulted for thoracocentesis but did not appreciate enough fluid for intervention.  Renal US with moderate left hydronephrosis and 13 mm nonobstructing calculus in left lower pole.  AKI resolving.  Remains on IV cefepime and Flagyl for aspiration pneumonia.  Started IV Lasix with albumin for volume overload.  TTE pending.  Now with hematuria.  Urology consulted  During further workup, pt was found to have extensive BLE DVT.  Assessment and Plan: Acute respiratory failure with hypoxia likely due to aspiration pneumonia, pleural effusion, atelectasis and possible acute unknown type CHF.    CXR with significant right pleural effusion and RLL opacity. CT chest with progressive RML and LLL consolidation, bilateral pleural effusion, large hiatal hernia with the stomach and portion of transverse colon within the chest. Given patient's known large hiatal hernia, significant concern for aspiration pneumonia.  Pro-Cal elevated but improving.  Pleural effusion, elevated BNP and troponin initially raised concern for acute CHF.  Low suspicion for PE. -completed IV cefepime and Flagyl -Wean oxygen as able, incentive telemetry, OOB/PT/OT -IR consulted for thoracocentesis but not enough fluid by bedside ultrasound -Continued on IV Lasix with IV albumin -2d echo reviewed. Findings of normal LVEF. RV function is normal -Procal has been trending down -Aspiration precaution and SLP eval   Right pleural effusion-unclear if this is CHF related or parapneumonic. -IR consulted but did not appreciate enough fluid by bedside US. -Continued on IV Lasix with albumin as above.   Gross hematuria/pyuria-traumatic from Foley replacement?  UA with pyuria and hematuria but culture negative. BPH/chronic urinary retention with chronic Foley. Moderate left hydronephrosis/nephrolithiasis: on renal US along with 13 mm nonobstructing calculus in left lower pole.  -CT abd reviewed. Findings notable for large bladder mass with continued hydronephrosis. -Urology was consulted.  -Continue home Proscar and Foley catheter. Urology recommendations against surgical managment -UTI ruled out.  -See below. Findings of extensive BLE DVT burden. Recently discussed with Urology, does not recommend anticoagulation in light of hematuria   AKI/azotemia: Likely prerenal from poor p.o. intake.  Seems he is also on diuretics and ibuprofen.  Renal US with moderate hydronephrosis.  -Renal function remains stable -recheck bmet in aM  Acute blood loss anemia the setting of gross hematuria -Continue monitoring -Discontinued subcu  heparin   BLE edema, left > right secondary to BLE DVT -LE dopplers reviewed, findings notable for extensive BLE DVT -Given hematuria, not candidate for anticoagulation -Discussed with family and consulted IR. Family had agreed to IVC filter placement. Pt now s/p filter placement 9/22 -Palliative Care following   Elevated troponin: Patient without chest pain.  EKG without acute finding.  Suspect demand ischemia from hypoxia and infection as well as delayed clearance from AKI.  No significant delta either. -2d echo per above   Large hiatal hernia:  -Aspiration precaution and SLP eval   Generalized weakness/physical deconditioning -PT/OT   Goal of care counseling: Remains full code.   -Palliative Care following  Leukocytosis -WBC up to 26.4 - Afebrile - Ordered and reviewed CXR. Findings suggestive of edema -Urine cx ordered   Body mass index is 23.89 kg/m.    Subjective: Without complaints, pleasantly confused  Physical Exam: Vitals:   07/20/22 1726 07/20/22 2156 07/21/22 0531 07/21/22 1008  BP: (!) 109/57 114/60 111/64 121/60  Pulse: 83 81 82 80  Resp: '20 16 16 18  '$ Temp: 98.2 F (36.8 C) 98.4 F (36.9 C) 98.4 F (36.9 C) 98.5 F (36.9 C)  TempSrc: Oral   Oral  SpO2: 92% 94% 95% 95%  Weight:      Height:       General exam: Conversant, in no acute distress Respiratory system: normal chest rise, clear, no audible wheezing Cardiovascular system: regular rhythm, s1-s2 Gastrointestinal system: Nondistended, nontender, pos BS Central nervous system: No seizures, no tremors Extremities: No cyanosis, no joint deformities Skin: No rashes, no pallor Psychiatry: Affect normal // no auditory hallucinations   Data Reviewed:  Labs reviewed: Na 142, K 3.4, Cr 1.20, Hgb 8.6   Family Communication: Pt in room, family not at bedside currently  Disposition: Status is: Inpatient Remains inpatient appropriate because: Severity of illness  Planned Discharge Destination:  Home     Author: Marylu Lund, MD 07/21/2022 4:06 PM  For on call review [email or web address].

## 2022-07-21 NOTE — Progress Notes (Addendum)
Palliative:  HPI: 88 y.o. male  with past medical history of prostate cancer, urinary retention/bilateral hydroureteronephrosis with chronic Foley catheter, and large hiatal hernia.  He presented to [facility] ED from [facility] on 07/26/2022 with elevated creatinine and chest x-ray concerning for pulmonary edema.  He was admitted to [facility] service with aspiration pneumonia and AKI.  I reviewed records and discussed with Dr. Wyline Copas. Dr. Wyline Copas has had further conversation with son explaining the concern for bladder cancer but Mr. Study is not a good candidate for aggressive measures or treatment. Poor progression and anticipation that he will continue to decline. Overall failure to thrive.   Mr. Busbee is very lethargic. He awakens to voice and will respond with short response and immediately closes his eyes back to sleep. Unable to have conversation. RN reports that he is more awake typically later in the day but still with confusion and unable to participate in conversation. No family at bedside. RN also reports ongoing hematuria and poor intake even on clear liquid diet. He is not really progressing towards improvement.   I attempted to call to speak with son, Kip. I was unable to reach Kip to continue goals of care conversations. I have asked for my colleagues to follow up on conversation tomorrow.   Exam: Lethargic. Confused. No distress. Breathing regular, unlabored. Abd soft. Blood-tinged urine in foley bag. Generalized weakness.   Plan: - Needs ongoing goals of care conversations.   25 min  Vinie Sill, NP Palliative Medicine Team Pager [phone] (Please see [email or web address] for schedule) Team Phone [phone]    Greater than 50%  of this time was spent counseling and coordinating care related to the above assessment and plan

## 2022-07-22 DIAGNOSIS — Z515 Encounter for palliative care: Secondary | ICD-10-CM | POA: Diagnosis not present

## 2022-07-22 DIAGNOSIS — Z7189 Other specified counseling: Secondary | ICD-10-CM | POA: Diagnosis not present

## 2022-07-22 DIAGNOSIS — Z66 Do not resuscitate: Secondary | ICD-10-CM

## 2022-07-22 DIAGNOSIS — J9601 Acute respiratory failure with hypoxia: Secondary | ICD-10-CM | POA: Diagnosis not present

## 2022-07-22 DIAGNOSIS — J690 Pneumonitis due to inhalation of food and vomit: Secondary | ICD-10-CM | POA: Diagnosis not present

## 2022-07-22 DIAGNOSIS — R778 Other specified abnormalities of plasma proteins: Secondary | ICD-10-CM | POA: Diagnosis not present

## 2022-07-22 LAB — URINE CULTURE: Culture: NO GROWTH

## 2022-07-22 MED ORDER — BISACODYL 10 MG RE SUPP: 10.0000 mg | Freq: Every day | RECTAL | Status: DC | PRN

## 2022-07-22 MED ORDER — POLYVINYL ALCOHOL 1.4 % OP SOLN
1.0000 [drp] | Freq: Four times a day (QID) | OPHTHALMIC | Status: DC | PRN
  Filled 2022-07-22: qty 15

## 2022-07-22 MED ORDER — HYDROMORPHONE HCL 1 MG/ML IJ SOLN
0.5000 mg | Freq: Three times a day (TID) | INTRAMUSCULAR | Status: DC
  Administered 2022-07-22: 0.5 mg via INTRAVENOUS
  Filled 2022-07-22: qty 1

## 2022-07-22 MED ORDER — HYDROMORPHONE HCL 1 MG/ML IJ SOLN
0.5000 mg | INTRAMUSCULAR | Status: DC | PRN
  Administered 2022-07-22: 0.5 mg via INTRAVENOUS
  Filled 2022-07-22: qty 1

## 2022-07-22 MED ORDER — BIOTENE DRY MOUTH MT LIQD: 15.0000 mL | OROMUCOSAL | Status: DC | PRN

## 2022-07-22 MED ORDER — LORAZEPAM 2 MG/ML IJ SOLN: 0.5000 mg | INTRAMUSCULAR | Status: DC | PRN

## 2022-07-22 MED ORDER — GLYCOPYRROLATE 0.2 MG/ML IJ SOLN
0.4000 mg | INTRAMUSCULAR | Status: DC | PRN
  Administered 2022-07-22: 0.4 mg via INTRAVENOUS
  Filled 2022-07-22: qty 2

## 2022-07-22 NOTE — Progress Notes (Addendum)
Progress Note   Patient: Kevin Carr HWE:[id number] DOB: 01/18/1926 DOA: 07/04/2022     8 DOS: the patient was seen and examined on 07/22/2022   Brief hospital course: 88 year old M with PMH of large hiatal hernia, urinary retention/bilateral hydroureteronephrosis with chronic Foley catheter and prostate cancer presenting with abnormal labs including elevated creatinine and chest x-ray concerning for pulmonary edema, and admitted for aspiration pneumonia and AKI.  Family reports overall decline over the last 2 months, mainly over the last 1 week.    In ED, slightly hypotensive to 90/55.  Saturating at 100% on 4 L. Cr 2.92 (baseline 1.0).  BUN 50.  WBC 17.3 with left shift.  Hgb 8.5 (baseline 10-11).  Troponin 481.  BNP 1050.  Lactic acid 0.9.  EKG NSR with LVH but no acute ischemic finding.  UA with large LE and large Hgb with 21-50 RBC per hpf but obtained from chronic Foley.  Foley catheter exchanged in ED.  CXR showed large hiatal hernia, right pleural effusion with associated atelectasis/airspace disease and left basilar atelectasis/airspace disease.  Cultures obtained.  CT chest ordered.  Patient was started on IV cefepime and admitted.   CT chest with progressive RML and LLL consolidation, bilateral pleural effusion, large hiatal hernia with the stomach and portion of transverse colon within the chest.  IR consulted for thoracocentesis but did not appreciate enough fluid for intervention.  Renal US with moderate left hydronephrosis and 13 mm nonobstructing calculus in left lower pole.  AKI resolving.  Remains on IV cefepime and Flagyl for aspiration pneumonia.  Started IV Lasix with albumin for volume overload.  TTE pending.  Now with hematuria.  Urology consulted  During further workup, pt was found to have extensive BLE DVT.  Assessment and Plan: Acute respiratory failure with hypoxia likely due to aspiration pneumonia, pleural effusion, atelectasis and possible acute unknown type CHF.    CXR with significant right pleural effusion and RLL opacity. CT chest with progressive RML and LLL consolidation, bilateral pleural effusion, large hiatal hernia with the stomach and portion of transverse colon within the chest. Given patient's known large hiatal hernia, significant concern for aspiration pneumonia.  -completed IV cefepime and Flagyl -IR was consulted for thoracocentesis but not enough fluid by bedside ultrasound -Patient had been continued on IV Lasix with IV albumin, however patient later no longer tolerated lasix secondary to hypotension -On discussion with Palliative Care, patient's wishes were noted to be full comfort. Pt now DNR   Right pleural effusion -IR consulted but did not appreciate enough fluid by bedside US. -Had been continued on IV Lasix with albumin as above, patient no longer able to tolerate secondary to hypotension   Gross hematuria/pyuria-traumatic from Foley replacement?  UA with pyuria and hematuria but culture negative. BPH/chronic urinary retention with chronic Foley. Moderate left hydronephrosis/nephrolithiasis: on renal US along with 13 mm nonobstructing calculus in left lower pole.  -CT abd reviewed. Findings notable for large bladder mass with continued hydronephrosis. -Urology was consulted.  -Continue home Proscar and Foley catheter. Urology recommendations against surgical managment -UTI ruled out.  -See below. Findings of extensive BLE DVT burden. Discussed with Urology, does not recommend anticoagulation in light of hematuria. IVC filter placed per below   AKI/azotemia: Likely prerenal from poor p.o. intake.  Seems he is also on diuretics and ibuprofen.  Renal US with moderate hydronephrosis.  -Now on comfort measures   Acute blood loss anemia the setting of gross hematuria -Discontinued subcu heparin   BLE edema,  left > right secondary to BLE DVT -LE dopplers reviewed, findings notable for extensive BLE DVT -Given hematuria, not candidate  for anticoagulation -Discussed with family and consulted IR. Family had agreed to IVC filter placement. Pt now s/p filter placed   Elevated troponin: Patient without chest pain.  EKG without acute finding.  Suspect demand ischemia from hypoxia and infection as well as delayed clearance from AKI.  No significant delta either. -2d echo per above   Large hiatal hernia:  -Aspiration precaution and SLP eval   Generalized weakness/physical deconditioning -now on comfort measures   Goal of care counseling:  -Palliative Care was consulted -on further goals of care discussions, patient's wishes were noted to be DNR/DNI and pt was transitioned to full comfort measures 9/24  Leukocytosis -WBC up to 26.4 - Remained afebrile - Ordered and reviewed CXR. Findings suggestive of edema  DNR  End of life care   Body mass index is 23.89 kg/m.    Subjective: Unable to assess given mentation  Physical Exam: Vitals:   07/21/22 2102 07/22/22 0543 07/22/22 0920 07/22/22 0923  BP: (!) 102/59 (!) 100/54 (!) 76/50 (!) 89/53  Pulse: 84 83 72 84  Resp: '19 14  13  '$ Temp: 98.4 F (36.9 C) 97.9 F (36.6 C) 98 F (36.7 C) 98 F (36.7 C)  TempSrc: Axillary  Oral Oral  SpO2: 96% (P) 92% 96% 96%  Weight:      Height:       General exam: Awake, laying in bed, in nad Respiratory system: Normal respiratory effort, no wheezing Cardiovascular system: regular rate, s1, s2 Gastrointestinal system: Soft, nondistended, positive BS Central nervous system: CN2-12 grossly intact, strength intact Extremities: Perfused, no clubbing Skin: Normal skin turgor, no notable skin lesions seen Psychiatry: Difficult to assess given mentation  Data Reviewed:  There are no new results to review at this time.  Family Communication: Pt in room, pt's son at bedside  Disposition: Status is: Inpatient Remains inpatient appropriate because: Severity of illness  Planned Discharge Destination:  Home     Author: Marylu Lund, MD 07/22/2022 4:24 PM  For on call review [email or web address].

## 2022-07-22 NOTE — Progress Notes (Signed)
Palliative Medicine Inpatient Follow Up Note HPI: 88 y.o. male  with past medical history of prostate cancer, urinary retention/bilateral hydroureteronephrosis with chronic Foley catheter, and large hiatal hernia.  He presented to [facility] ED from [facility] on 07/02/2022 with elevated creatinine and chest x-ray concerning for pulmonary edema.  He was admitted to [facility] service with aspiration pneumonia and AKI.  Palliative care was asked to get involved to further address goals of care.  Today's Discussion 07/22/2022  *Please note that this is a verbal dictation therefore any spelling or grammatical errors are due to the "[place]" system interpretation.  Chart reviewed inclusive of vital signs, progress notes, laboratory results, and diagnostic images.   I spoke to patient's RN Seth Bake upon morning rounds.  She expresses that Ramelo has not been able to eat or drink anything throughout the shift yesterday.  He is continuously grimacing and while I was at bedside he was noted to be moaning.  We reviewed that overall he has been doing very poorly per the nursing staff's assessment.  Later in the morning through secure chat it was identified that, [facility] had a drop in his blood pressure.  I called patient's son, Kip -I explained the gravity of the present situation and that Rayshun is not thriving and if anything continues to decline daily. Kip shares awareness and understanding of this though expresses how difficult this news is as the patient was so very independent just a few months ago. Created space and opportunity for patients son to explore thoughts feelings and fears regarding current medical situation.  We reviewed that unfortunately the present more aggressive focused modality of care is not making much of a difference and/or improvement in Xaivier's situation.  I discussed that at this point in time it may be prudent to consider more of a comfort oriented emphasis of care.We talked about  transition to comfort measures in house and what that would entail inclusive of medications to control pain, dyspnea, agitation, nausea, itching, and hiccups.  We discussed stopping all uneccessary measures such as cardiac monitoring, blood draws, needle sticks, and frequent vital signs. Utilized reflective listening throughout our time together.   We discussed that when a patient transitions to comfort care we modify code status to a DO NOT RESUSCITATE/DO NOT INTUBATE CODE STATUS.  Patient's son understands this and is in agreement with it.  Patient's son expresses that he does not want to see his father suffer and in this instance would desire for him to be made comfortable.  I shared with Kip that at this juncture his biggest responsibility will be to be present for his father as he Journeys from this life to the next.  Questions and concerns addressed/Palliative Support Provided.   Objective Assessment: Vital Signs Vitals:   07/22/22 0920 07/22/22 0923  BP: (!) 76/50 (!) 89/53  Pulse: 72 84  Resp:  13  Temp: 98 F (36.7 C) 98 F (36.7 C)  SpO2: 96% 96%    Intake/Output Summary (Last 24 hours) at 07/22/2022 9678 Last data filed at 07/22/2022 0200 Gross per 24 hour  Intake 60 ml  Output 1800 ml  Net -1740 ml   Last Weight  Most recent update: 07/19/2022  6:11 AM    Weight  75.5 kg (166 lb 7.2 oz)            Gen: Elderly Caucasian male moderate distress HEENT: Dry mucous membranes CV: Regular rate and rhythm PULM: On 3 L nasal cannula breathing is  even and nonlabored ABD: soft/nontender  EXT: No edema  Neuro: Opens his eyes and whispers in response aware of self  SUMMARY OF RECOMMENDATIONS   DNAR/DNI  Emphasis on comfort at this juncture  Comfort medications per [facility]  Dilaudid 0.'5mg'$  PO Q8H ATC as well as Q1H PRN  Unrestricted visitation  Chaplain support  Ongoing Palliative engagement  Prognosis limited to days, likely an in hospital death  Time Spent:  65  Billing based on MDM: High  Problems Addressed: One acute or chronic illness or injury that poses a threat to life or bodily function  Amount and/or Complexity of Data: Category 3:Discussion of management or test interpretation with external physician/other qualified health care professional/appropriate source (not separately reported)  Risks: Parenteral controlled substances and Decision not to resuscitate or to de-escalate care because of poor prognosis ______________________________________________________________________________________ [place] Team Team Cell Phone: [phone] Please utilize secure chat with additional questions, if there is no response within 30 minutes please call the above phone number  Palliative Medicine Team providers are available by phone from 7am to 7pm daily and can be reached through the team cell phone.  Should this patient require assistance outside of these hours, please call the patient's attending physician.

## 2022-07-23 DIAGNOSIS — J690 Pneumonitis due to inhalation of food and vomit: Secondary | ICD-10-CM | POA: Diagnosis not present

## 2022-07-23 DIAGNOSIS — R778 Other specified abnormalities of plasma proteins: Secondary | ICD-10-CM | POA: Diagnosis not present

## 2022-07-23 DIAGNOSIS — J9601 Acute respiratory failure with hypoxia: Secondary | ICD-10-CM | POA: Diagnosis not present

## 2022-07-23 NOTE — Progress Notes (Signed)
Patient EM:Kevin Carr      DOB: [birth year]      EFE:[id number]      Palliative Medicine Team    Subjective: Bedside symptom check completed. Son bedside at time of visit.    Physical exam: Patient resting in bed with eyes closed at time of visit. Breathing even and non-labored with nasal cannula applied, no excessive secretions noted. Patient without physical or non-verbal signs of pain or discomfort at this time. Patient does not acknowledge this RN with verbal and light tactile stimulation. This RN did not attempt to further stimulate for response. Patient's extremities warm in all four with palpable distal pulses. No color changes noted this morning.    Assessment and plan: This RN visited with son bedside. Discussed assessment, son would like to continue oxygen for comfort, decreased to 2L per nasal cannula. Discussed use of scheduled vs PRN medications to prevent respiratory symptoms, with the PRN agents available if needed. Bedside RN without needs or concerns this morning. Patient appears stable this morning, family would like to keep in house for passing. All questions answered bedside. Will continue to follow for any changes or advances.    Thank you for allowing the Palliative Medicine Team to assist in the care of this patient.     Damian Leavell, MSN, [place] Palliative Medicine Team Team Phone: [phone]  This phone is monitored 7a-7p, please reach out to attending physician outside of these hours for urgent needs.

## 2022-07-23 NOTE — Progress Notes (Signed)
   07/23/22 1310  Clinical Encounter Type  Visited With Family  Visit Type Initial;Spiritual support  Referral From Nurse  Consult/Referral To Chaplain   Chaplain responded to a request for support. At the time of the visit the patient was sleeping. I spoke with the patient, Kevin Carr's son. He thanked me for coming and that his father's pastor had come the day prior and that his father was comforted with that conversation. As I departed I advised the son that if he would like to speak with a chaplain later to let a nurse know and we would respond.   Danice Goltz [facility]  [phone]

## 2022-07-23 NOTE — TOC Progression Note (Signed)
Transition of Care [facility]) - Initial/Assessment Note    Patient Details  Name: Kevin Carr MRN: [id number] Date of Birth: 07-Dec-1925  Transition of [facility]) CM/SW Contact:    Milinda Antis, [place] Phone Number: 07/23/2022, 10:33 AM  Clinical Narrative:                 Patient transitioned to comfort care. Family prefers to keep the patient in hospital for passing.  TOC signing off.  If new patient transition needs arise, please place a TOC consult.          Patient Goals and CMS Choice        Expected Discharge Plan and Services                                                Prior Living Arrangements/Services                       Activities of Daily Living Home Assistive Devices/Equipment: Oxygen, Hoyer Lift (pt. from SNF) ADL Screening (condition at time of admission) Patient's cognitive ability adequate to safely complete daily activities?: No Is the patient deaf or have difficulty hearing?: No Does the patient have difficulty seeing, even when wearing glasses/contacts?: Yes Does the patient have difficulty concentrating, remembering, or making decisions?: Yes Patient able to express need for assistance with ADLs?: Yes Does the patient have difficulty dressing or bathing?: Yes Independently performs ADLs?: No Communication: Dependent Is this a change from baseline?: Pre-admission baseline Dressing (OT): Dependent Is this a change from baseline?: Pre-admission baseline Grooming: Dependent Is this a change from baseline?: Pre-admission baseline Feeding: Needs assistance Is this a change from baseline?: Pre-admission baseline Bathing: Dependent Is this a change from baseline?: Pre-admission baseline Toileting: Dependent Is this a change from baseline?: Pre-admission baseline In/Out Bed: Dependent Is this a change from baseline?: Pre-admission baseline Walks in Home: Dependent Is this a change from baseline?: Pre-admission  baseline Does the patient have difficulty walking or climbing stairs?: Yes Weakness of Legs: Both Weakness of Arms/Hands: Both  Permission Sought/Granted                  Emotional Assessment              Admission diagnosis:  Aspiration pneumonia ([place]) [J69.0] AKI (acute kidney injury) ([place]) [N17.9] Aspiration pneumonia of both lungs, unspecified aspiration pneumonia type, unspecified part of lung ([place]) [J69.0] Patient Active Problem List   Diagnosis Date Noted   Gross hematuria 07/17/2022   Goals of care, counseling/discussion 07/15/2022   Elevated troponin 07/15/2022   Aspiration pneumonia ([place]) 07/17/2022   Acute respiratory failure with hypoxia ([place]) 07/19/2022   Wheelchair dependence 07/12/2022   Urinary retention 07/21/2022   AKI (acute kidney injury) ([place]) 05/30/2022   Hypothyroidism 05/30/2022   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

## 2022-07-23 NOTE — Progress Notes (Signed)
Progress Note   Patient: Kevin Carr MBW:[id number] DOB: Mar 12, 1926 DOA: 07/09/2022     9 DOS: the patient was seen and examined on 07/23/2022   Brief hospital course: 88 year old M with PMH of large hiatal hernia, urinary retention/bilateral hydroureteronephrosis with chronic Foley catheter and prostate cancer presenting with abnormal labs including elevated creatinine and chest x-ray concerning for pulmonary edema, and admitted for aspiration pneumonia and AKI.  Family reports overall decline over the last 2 months, mainly over the last 1 week.    In ED, slightly hypotensive to 90/55.  Saturating at 100% on 4 L. Cr 2.92 (baseline 1.0).  BUN 50.  WBC 17.3 with left shift.  Hgb 8.5 (baseline 10-11).  Troponin 481.  BNP 1050.  Lactic acid 0.9.  EKG NSR with LVH but no acute ischemic finding.  UA with large LE and large Hgb with 21-50 RBC per hpf but obtained from chronic Foley.  Foley catheter exchanged in ED.  CXR showed large hiatal hernia, right pleural effusion with associated atelectasis/airspace disease and left basilar atelectasis/airspace disease.  Cultures obtained.  CT chest ordered.  Patient was started on IV cefepime and admitted.   CT chest with progressive RML and LLL consolidation, bilateral pleural effusion, large hiatal hernia with the stomach and portion of transverse colon within the chest.  IR consulted for thoracocentesis but did not appreciate enough fluid for intervention.  Renal US with moderate left hydronephrosis and 13 mm nonobstructing calculus in left lower pole.  AKI resolving.  Remains on IV cefepime and Flagyl for aspiration pneumonia.  Started IV Lasix with albumin for volume overload.  TTE pending.  Now with hematuria.  Urology consulted  During further workup, pt was found to have extensive BLE DVT.  Assessment and Plan: Acute respiratory failure with hypoxia likely due to aspiration pneumonia, pleural effusion, atelectasis and possible acute unknown type CHF.    CXR with significant right pleural effusion and RLL opacity. CT chest with progressive RML and LLL consolidation, bilateral pleural effusion, large hiatal hernia with the stomach and portion of transverse colon within the chest. Given patient's known large hiatal hernia, significant concern for aspiration pneumonia.  -completed IV cefepime and Flagyl -IR was consulted for thoracocentesis but not enough fluid by bedside ultrasound -Patient had been continued on IV Lasix with IV albumin, however patient later no longer tolerated lasix secondary to hypotension -On discussion with Palliative Care, patient's wishes were noted to be full comfort. Pt now DNR on comfort measures, anticipating hospital death   Right pleural effusion -IR consulted but did not appreciate enough fluid by bedside US. -Had been continued on IV Lasix with albumin as above, patient no longer able to tolerate secondary to hypotension   Gross hematuria/pyuria-traumatic from Foley replacement?  UA with pyuria and hematuria but culture negative. BPH/chronic urinary retention with chronic Foley. Moderate left hydronephrosis/nephrolithiasis: on renal US along with 13 mm nonobstructing calculus in left lower pole.  -CT abd reviewed. Findings notable for large bladder mass with continued hydronephrosis. -Urology was consulted.  -Continue home Proscar and Foley catheter. Urology recommendations against surgical managment -UTI ruled out.  -See below. Findings of extensive BLE DVT burden. Discussed with Urology, does not recommend anticoagulation in light of hematuria. IVC filter placed per below   AKI/azotemia: Likely prerenal from poor p.o. intake.  Seems he is also on diuretics and ibuprofen.  Renal US with moderate hydronephrosis.  -Now on comfort measures   Acute blood loss anemia the setting of gross hematuria -Discontinued  subcu heparin   BLE edema, left > right secondary to BLE DVT -LE dopplers reviewed, findings notable for  extensive BLE DVT -Given hematuria, not candidate for anticoagulation -Discussed with family and consulted IR. Family had agreed to IVC filter placement. Pt now s/p filter placed   Elevated troponin: Patient without chest pain.  EKG without acute finding.  Suspect demand ischemia from hypoxia and infection as well as delayed clearance from AKI.  No significant delta either. -2d echo per above   Large hiatal hernia:  -Aspiration precaution and SLP eval   Generalized weakness/physical deconditioning -now on comfort measures   Goal of care counseling:  -Palliative Care was consulted -on further goals of care discussions, patient's wishes were noted to be DNR/DNI and pt was transitioned to full comfort measures 9/24 with plans for hospital death  Leukocytosis -WBC up to 26.4 - Remained afebrile - Ordered and reviewed CXR. Findings suggestive of edema -Later transitioned to comfort measures  DNR  End of life care   Body mass index is 23.89 kg/m.    Subjective: Cannot assess given mentation  Physical Exam: Vitals:   07/22/22 0543 07/22/22 0920 07/22/22 0923 07/22/22 2157  BP: (!) 100/54 (!) 76/50 (!) 89/53 (!) 83/55  Pulse: 83 72 84 95  Resp: '14  13 15  '$ Temp: 97.9 F (36.6 C) 98 F (36.7 C) 98 F (36.7 C) (!) 97.4 F (36.3 C)  TempSrc:  Oral Oral Oral  SpO2: 92% 96% 96% 93%  Weight:      Height:       General exam: Not conversant, in no acute distress Respiratory system: normal chest rise, clear, no audible wheezing Cardiovascular system: perfused Gastrointestinal system: Nondistended Central nervous system: No seizures, no tremors Extremities: No cyanosis, no joint deformities Skin: No rashes, no pallor Psychiatry: Unable to assess given mentation  Data Reviewed:  There are no new results to review at this time.  Family Communication: Pt in room, pt's family at bedside  Disposition: Status is: Inpatient Remains inpatient appropriate because: Severity of  illness  Planned Discharge Destination: Home    Author: Marylu Lund, MD 07/23/2022 1:51 PM  For on call review [email or web address].

## 2022-07-23 NOTE — Plan of Care (Signed)
  Problem: Education: Goal: Understanding of CV disease, CV risk reduction, and recovery process will improve Outcome: Not Applicable Goal: Individualized Educational Video(s) Outcome: Not Applicable   Problem: Activity: Goal: Ability to return to baseline activity level will improve Outcome: Not Applicable   Problem: Cardiovascular: Goal: Ability to achieve and maintain adequate cardiovascular perfusion will improve Outcome: Not Applicable Goal: Vascular access site(s) Level 0-1 will be maintained Outcome: Not Applicable   Problem: Health Behavior/Discharge Planning: Goal: Ability to safely manage health-related needs after discharge will improve Outcome: Not Applicable

## 2022-07-23 NOTE — Plan of Care (Signed)
  Problem: Health Behavior/Discharge Planning: Goal: Ability to manage health-related needs will improve Outcome: Progressing   

## 2022-07-24 DIAGNOSIS — J690 Pneumonitis due to inhalation of food and vomit: Secondary | ICD-10-CM | POA: Diagnosis not present

## 2022-07-24 DIAGNOSIS — J9601 Acute respiratory failure with hypoxia: Secondary | ICD-10-CM | POA: Diagnosis not present

## 2022-07-24 DIAGNOSIS — R778 Other specified abnormalities of plasma proteins: Secondary | ICD-10-CM | POA: Diagnosis not present

## 2022-07-24 LAB — CYTOLOGY - NON PAP

## 2022-07-29 NOTE — Progress Notes (Signed)
HOSPITAL MEDICINE OVERNIGHT EVENT NOTE    Notified by nursing that patient has unfortunately expired, time of death 6 AM.  2 RNs pronounced.  Patient has been hospitalized for acute respiratory failure multifactorial secondary to aspiration pneumonia with superimposed acute congestive heart failure.  Family was at bedside at the time of death and are in the process of calling other family members to pay their respects.  Family services will be notified.  Vernelle Emerald  MD [facility]

## 2022-07-29 NOTE — Discharge Summary (Signed)
Death Summary  Kevin Carr ZOX:[id number] DOB: 1925/12/10 DOA: 2022/07/17  PCP: Lujean Amel, MD  Admit date: 07-17-2022 Date of Death: 07/27/2022 Time of Death: 0600 Notification: Lujean Amel, MD notified of death of July 27, 2022   History of present illness:  Acute respiratory failure with hypoxia likely due to aspiration pneumonia, pleural effusion, atelectasis and possible acute unknown type CHF.   CXR with significant right pleural effusion and RLL opacity. CT chest with progressive RML and LLL consolidation, bilateral pleural effusion, large hiatal hernia with the stomach and portion of transverse colon within the chest. Given patient's known large hiatal hernia, significant concern for aspiration pneumonia.  -completed IV cefepime and Flagyl -IR was consulted for thoracocentesis but not enough fluid by bedside ultrasound -Patient had been continued on IV Lasix with IV albumin, however patient later no longer tolerated lasix secondary to hypotension -On discussion with Palliative Care, patient's wishes were later noted to be full comfort. Pt later transitioned to DNR on comfort measures -On Jul 28, 2023 at 600, patient was pronounced   Right pleural effusion -IR consulted but did not appreciate enough fluid by bedside US. -Had been continued on IV Lasix with albumin as above, patient no longer able to tolerate secondary to hypotension. Patient later transitioned to comfort measures   Gross hematuria/pyuria-traumatic from Foley replacement?  UA with pyuria and hematuria but culture negative. BPH/chronic urinary retention with chronic Foley. Moderate left hydronephrosis/nephrolithiasis: on renal US along with 13 mm nonobstructing calculus in left lower pole.  -CT abd reviewed. Findings notable for large bladder mass with continued hydronephrosis. -Urology was consulted.  -Continue home Proscar and Foley catheter. Urology recommendations against surgical managment -UTI ruled out.  -See  below. Findings of extensive BLE DVT burden. Discussed with Urology, did not recommend anticoagulation in light of hematuria. IVC filter later placed per below   AKI/azotemia: Likely prerenal from poor p.o. intake.  Renal US with moderate hydronephrosis.  -Later transitioned to comfort measures   Acute blood loss anemia the setting of gross hematuria -Discontinued subcu heparin   BLE edema, left > right secondary to BLE DVT -LE dopplers reviewed, findings notable for extensive BLE DVT -Given hematuria, not candidate for anticoagulation -IVC filter was later placed   Elevated troponin: Patient without chest pain.  EKG without acute finding.  Suspect demand ischemia from hypoxia and infection as well as delayed clearance from AKI.  No significant delta either. -2d echo per above   Large hiatal hernia:  -Aspiration precaution and SLP eval provided this visit   Generalized weakness/physical deconditioning - Later transitioned to comfort measures   Goal of care counseling:  -Palliative Care was consulted -on further goals of care discussions, patient's wishes were noted to be DNR/DNI and pt was transitioned to full comfort measures 9/24  -On July 28, 2023, at 0600, pt was pronounced   Leukocytosis -WBC up to 26.4 - Remained afebrile - Ordered and reviewed CXR. Findings suggestive of edema -Later transitioned to comfort measures   DNR   End of life care   The results of significant diagnostics from this hospitalization (including imaging, microbiology, ancillary and laboratory) are listed below for reference.    Significant Diagnostic Studies: DG CHEST PORT 1 VIEW  Result Date: 07/21/2022 CLINICAL DATA:  [id number] leukocytosis EXAM: PORTABLE CHEST 1 VIEW COMPARISON:  July 18, 2022 FINDINGS: Evaluation is limited by patient positioning. Incomplete assessment of the LEFT apex. Grossly unchanged enlarged cardiomediastinal silhouette. Atherosclerotic calcifications. Small RIGHT pleural  effusion. Mild diffuse reticular prominence consistent with underlying pulmonary  edema. Persistent LEFT basilar homogeneous opacification. Visualized abdomen is unremarkable. IVC filter. IMPRESSION: Constellation of findings are favored to reflect mild pulmonary edema with LEFT basilar atelectasis. Superimposed infection could present similarly. Electronically Signed   By: Valentino Saxon M.D.   On: 07/21/2022 12:12   IR IVC FILTER PLMT / S&I Burke Keels GUID/MOD SED  Result Date: 07/20/2022 CLINICAL DATA:  88 year old male with history of bilateral lower extremity deep vein thrombosis and persistent hematuria, contraindicating anticoagulation. EXAM: 1. ULTRASOUND GUIDANCE FOR VASCULAR ACCESS OF THE RIGHT femoral VEIN. 2. IVC VENOGRAM. 3. PERCUTANEOUS IVC FILTER PLACEMENT. ANESTHESIA/SEDATION: None. CONTRAST:  32m OMNIPAQUE IOHEXOL 300 MG/ML  SOLN FLUOROSCOPY TIME:  Twenty-seven mGy PROCEDURE: The procedure, risks, benefits, and alternatives were explained to the patient. Questions regarding the procedure were encouraged and answered. The patient understands and consents to the procedure. The right groin was prepped with chlorhexidine in a sterile fashion, and a sterile drape was applied covering the operative field. A sterile gown and sterile gloves were used for the procedure. Local anesthesia was provided with 1% Lidocaine. Under direct ultrasound guidance, a 21 gauge needle was advanced into the right common femoral vein with ultrasound image documentation performed. After securing access with a micropuncture dilator, a guidewire was advanced into the inferior vena cava. A deployment sheath was advanced over the guidewire. This was utilized to perform IVC venography. The deployment sheath was further positioned in an appropriate location for filter deployment. A Denali IVC filter was then advanced in the sheath. This was then fully deployed in the infrarenal IVC. Final filter position was confirmed with a  fluoroscopic spot image. Contrast injection was also performed through the sheath under fluoroscopy to confirm patency of the IVC at the level of the filter. After the procedure the sheath was removed and hemostasis obtained with manual compression. COMPLICATIONS: None. FINDINGS: IVC venography demonstrates a normal caliber IVC with no evidence of thrombus. Renal veins are identified bilaterally. The IVC filter was successfully positioned below the level of the renal veins and is appropriately oriented. This IVC filter has both permanent and retrievable indications. IMPRESSION: Placement of percutaneous IVC filter in infrarenal IVC. IVC venogram shows no evidence of IVC thrombus and normal caliber of the inferior vena cava. This filter does have both permanent and retrievable indications. PLAN: This IVC filter is potentially retrievable. The patient will be assessed for filter retrieval by Interventional Radiology in approximately 8-12 weeks. Further recommendations regarding filter retrieval, continued surveillance or declaration of device permanence, will be made at that time. DRuthann Cancer MD [facility] Electronically Signed   By: DRuthann CancerM.D.   On: 07/20/2022 12:45   VAS [place]OWER EXTREMITY VENOUS (DVT)  Result Date: 07/18/2022  Lower Venous DVT Study Patient Name:  MCURVIN HUNGER Date of Exam:   07/18/2022 Medical Rec #: [id number]       Accession #:    [id number] of Birth: [birth year]       Patient Gender: M Patient Age:   89years [facility]      VAS [place]OWER EXTREMITY VENOUS (DVT) Referring Phys: TBretta BangGONFA --------------------------------------------------------------------------------  Indications: Bilateral swelling, erythema, and tenderness. LT>RT.  Limitations: Poor ultrasound/tissue interface and patient pain/sensitivity to probe. Comparison Study: No prior studies available for comparison.  Performing Technologist: RDarlin CocoRDMS, RVT  Examination Guidelines: A complete evaluation includes B-mode imaging, spectral Doppler, color Doppler, and power Doppler as needed of all  accessible portions of each vessel. Bilateral testing is considered an integral part of a complete examination. Limited examinations for reoccurring indications may be performed as noted. The reflux portion of the exam is performed with the patient in reverse Trendelenburg.  +---------+---------------+---------+-----------+----------+-------------------+ RIGHT    CompressibilityPhasicitySpontaneityPropertiesThrombus Aging      +---------+---------------+---------+-----------+----------+-------------------+ CFV      Partial        Yes      Yes                  Acute               +---------+---------------+---------+-----------+----------+-------------------+ SFJ      Partial                                      Acute               +---------+---------------+---------+-----------+----------+-------------------+ FV Prox  None                                         Acute               +---------+---------------+---------+-----------+----------+-------------------+ FV Mid   Partial                                      Acute               +---------+---------------+---------+-----------+----------+-------------------+ FV DistalPartial                                      Acute               +---------+---------------+---------+-----------+----------+-------------------+ PFV      None                                         Age Indeterminate   +---------+---------------+---------+-----------+----------+-------------------+ POP      Partial                                      Acute               +---------+---------------+---------+-----------+----------+-------------------+ PTV                                                   Not well visualized  +---------+---------------+---------+-----------+----------+-------------------+ PERO                                                  Not well visualized +---------+---------------+---------+-----------+----------+-------------------+   +---------+---------------+---------+-----------+----------+-------------------+ LEFT     CompressibilityPhasicitySpontaneityPropertiesThrombus Aging      +---------+---------------+---------+-----------+----------+-------------------+ CFV      None           No  No                   Acute               +---------+---------------+---------+-----------+----------+-------------------+ SFJ      None           No       No                   Acute               +---------+---------------+---------+-----------+----------+-------------------+ FV Prox  Partial        Yes      Yes                  Age Indeterminate   +---------+---------------+---------+-----------+----------+-------------------+ FV Mid   None           No       No                   Age Indeterminate   +---------+---------------+---------+-----------+----------+-------------------+ FV DistalNone           Yes      Yes                  Age Indeterminate   +---------+---------------+---------+-----------+----------+-------------------+ PFV      None           No       No                   Age Indeterminate   +---------+---------------+---------+-----------+----------+-------------------+ POP      Partial        Yes      Yes                  Age Indeterminate   +---------+---------------+---------+-----------+----------+-------------------+ PTV                                                   Not well visualized +---------+---------------+---------+-----------+----------+-------------------+ PERO                                                  Not well visualized +---------+---------------+---------+-----------+----------+-------------------+      Summary: RIGHT: - Findings consistent with acute deep vein thrombosis involving the right common femoral vein, SF junction, right femoral vein, and right proximal profunda vein. - Findings consistent with age indeterminate deep vein thrombosis involving the right proximal profunda vein. - No cystic structure found in the popliteal fossa. - Unable to evaluate extension of common femoral vein obstruction proximal to the inguinal ligament.  LEFT: - Findings consistent with acute deep vein thrombosis involving the left common femoral vein, and SF junction. - Findings consistent with age indeterminate deep vein thrombosis involving the left femoral vein, left popliteal vein, and left proximal profunda vein. - No cystic structure found in the popliteal fossa. - Unable to evaluate extension of common femoral vein obstruction proximal to the inguinal ligament.  *See table(s) above for measurements and observations. Electronically signed by Jamelle Haring on 07/18/2022 at 4:25:50 PM.    Final    ECHOCARDIOGRAM COMPLETE  Result Date: 07/18/2022    ECHOCARDIOGRAM REPORT   Patient Name:   ALISTAIR SENFT  Givhan Date of Exam: 07/18/2022 Medical Rec #:  [id number]       Height:       71.0 in Accession #:    [id number]      Weight:       171.3 lb Date of Birth:  1926/01/22       BSA:          1.974 m Patient Age:    9 years        BP:           121/54 mmHg Patient Gender: M               HR:           56 bpm. Exam Location:  Inpatient Procedure: 2D Echo, Cardiac Doppler and Color Doppler Indications:    Dyspnea  History:        Patient has no prior history of Echocardiogram examinations.                 Risk Factors:Dyslipidemia.  Sonographer:    Memory Argue Referring Phys: [place]  1. Left ventricular ejection fraction, by estimation, is 55 to 60%. The left ventricle has normal function. The left ventricle demonstrates regional wall motion abnormalities (see scoring diagram/findings for description). Left  ventricular diastolic parameters are indeterminate. There is mild hypokinesis of the left ventricular, basal-mid inferoseptal wall, inferior wall and inferolateral wall.  2. Right ventricular systolic function is normal. The right ventricular size is normal.  3. Left atrial size was mildly dilated.  4. The mitral valve is normal in structure. Trivial mitral valve regurgitation. No evidence of mitral stenosis.  5. The aortic valve is tricuspid. There is mild calcification of the aortic valve. There is mild thickening of the aortic valve. Aortic valve regurgitation is not visualized. Aortic valve sclerosis/calcification is present, without any evidence of aortic stenosis. Comparison(s): No prior Echocardiogram. Conclusion(s)/Recommendation(s): Normal LVEF with very mild wall motion abnormalities as noted. FINDINGS  Left Ventricle: Left ventricular ejection fraction, by estimation, is 55 to 60%. The left ventricle has normal function. The left ventricle demonstrates regional wall motion abnormalities. Mild hypokinesis of the left ventricular, basal-mid inferoseptal  wall, inferior wall and inferolateral wall. The left ventricular internal cavity size was normal in size. There is no left ventricular hypertrophy. Left ventricular diastolic parameters are indeterminate. Right Ventricle: The right ventricular size is normal. Right vetricular wall thickness was not well visualized. Right ventricular systolic function is normal. Left Atrium: Left atrial size was mildly dilated. Right Atrium: Right atrial size was normal in size. Pericardium: There is no evidence of pericardial effusion. Mitral Valve: The mitral valve is normal in structure. Trivial mitral valve regurgitation. No evidence of mitral valve stenosis. Tricuspid Valve: The tricuspid valve is normal in structure. Tricuspid valve regurgitation is trivial. No evidence of tricuspid stenosis. Aortic Valve: The aortic valve is tricuspid. There is mild calcification of  the aortic valve. There is mild thickening of the aortic valve. Aortic valve regurgitation is not visualized. Aortic valve sclerosis/calcification is present, without any evidence of aortic stenosis. Aortic valve mean gradient measures 5.0 mmHg. Aortic valve peak gradient measures 9.2 mmHg. Aortic valve area, by VTI measures 2.42 cm. Pulmonic Valve: The pulmonic valve was not well visualized. Pulmonic valve regurgitation is not visualized. No evidence of pulmonic stenosis. Aorta: The aortic root and ascending aorta are structurally normal, with no evidence of dilitation and the aortic arch was not well visualized. Venous: The inferior vena cava  was not well visualized. IAS/Shunts: The interatrial septum was not well visualized.  LEFT VENTRICLE PLAX 2D LVIDd:         4.00 cm   Diastology LVIDs:         2.60 cm   LV e' medial:    6.64 cm/s LV PW:         0.80 cm   LV E/e' medial:  10.0 LV IVS:        0.90 cm   LV e' lateral:   10.60 cm/s LVOT diam:     2.20 cm   LV E/e' lateral: 6.3 LV SV:         76 LV SV Index:   39 LVOT Area:     3.80 cm  RIGHT VENTRICLE TAPSE (M-mode): 1.8 cm LEFT ATRIUM             Index        RIGHT ATRIUM           Index LA diam:        2.50 cm 1.27 cm/m   RA Area:     14.90 cm LA Vol (A2C):   45.1 ml 22.84 ml/m  RA Volume:   33.20 ml  16.82 ml/m LA Vol (A4C):   61.7 ml 31.25 ml/m LA Biplane Vol: 53.4 ml 27.05 ml/m  AORTIC VALVE AV Area (Vmax):    2.65 cm AV Area (Vmean):   2.60 cm AV Area (VTI):     2.42 cm AV Vmax:           152.00 cm/s AV Vmean:          100.000 cm/s AV VTI:            0.316 m AV Peak Grad:      9.2 mmHg AV Mean Grad:      5.0 mmHg LVOT Vmax:         106.00 cm/s LVOT Vmean:        68.300 cm/s LVOT VTI:          0.201 m LVOT/AV VTI ratio: 0.64  AORTA Ao Root diam: 3.90 cm Ao Asc diam:  3.40 cm MITRAL VALVE                TRICUSPID VALVE MV Area (PHT): 2.83 cm     TR Peak grad:   23.0 mmHg MV Decel Time: 268 msec     TR Vmax:        240.00 cm/s MV E velocity: 66.30  cm/s MV A velocity: 109.00 cm/s  SHUNTS MV E/A ratio:  0.61         Systemic VTI:  0.20 m                             Systemic Diam: 2.20 cm Buford Dresser MD Electronically signed by Buford Dresser MD Signature Date/Time: 07/18/2022/1:31:56 PM    Final    DG Chest 2 View  Result Date: 07/18/2022 CLINICAL DATA:  [id number]. Follow-up pleural effusions, or infiltrates. EXAM: CHEST - 2 VIEW COMPARISON:  Portable chest 07/05/2022, chest CT 07/15/2022 FINDINGS: The heart is enlarged. There is perihilar vascular congestion with increased interstitial edema throughout the lung fields. Small pleural effusions appear similar. There is increased patchy consolidation in the right mid and lower lung field, consolidation or atelectasis alongside the chronically elevated left diaphragm with large hiatal hernia with intrathoracic stomach and splenic flexure again shown. There is no  pneumothorax. There is aortic tortuosity and calcification with stable mediastinum. There is osteopenia with degenerative change of the spine. IMPRESSION: 1. Increased interstitial edema throughout the lungs. 2. Similar small pleural effusions with increasing patchy consolidation in the right mid and lower lung field with consolidation or atelectasis along the elevated left diaphragm. 3. Large hiatal hernia with intrathoracic stomach. 4. Aortic atherosclerosis. Electronically Signed   By: Telford Nab M.D.   On: 07/18/2022 06:23   CT ABDOMEN PELVIS WO CONTRAST  Result Date: 07/17/2022 CLINICAL DATA:  Hematuria EXAM: CT ABDOMEN AND PELVIS WITHOUT CONTRAST TECHNIQUE: Multidetector CT imaging of the abdomen and pelvis was performed following the standard protocol without IV contrast. RADIATION DOSE REDUCTION: This exam was performed according to the departmental dose-optimization program which includes automated exposure control, adjustment of the mA and/or kV according to patient size and/or use of iterative reconstruction technique.  COMPARISON:  CT 05/30/2022 FINDINGS: Lower chest: Trace bilateral pleural effusions with bibasilar atelectasis. There is a degree of ground-glass opacity and interlobular septal thickening within the right perihilar and right basilar region. Large hiatal hernia with atelectasis adjacent to the hernia sac. Aortic and coronary artery atherosclerosis. Hepatobiliary: Unremarkable unenhanced appearance of the liver. No focal liver lesion identified. Gallbladder within normal limits. No hyperdense gallstone. No biliary dilatation. Pancreas: Unremarkable. No pancreatic ductal dilatation or surrounding inflammatory changes. Spleen: Normal in size without focal abnormality. Adrenals/Urinary Tract: Unremarkable adrenal glands. Moderate-severe left hydroureteronephrosis similar to the previous CT. Resolved right hydronephrosis with mild right hydroureter, improved. Stable bilateral renal calculi. Urinary bladder is decompressed by Foley catheter. Abnormal lobulated soft tissue density surrounding the Foley catheter balloon compatible with bladder wall mass seen on previous CT. Calcifications within the posterior aspect of the bladder likely a combination of bladder stones/debris as well as partially calcified bladder wall mass. Stomach/Bowel: Large hiatal hernia with intrathoracic stomach. Gastric volvulus without evidence of gastric outlet obstruction. No dilated loops of small bowel. Extensive colonic diverticulosis. No focal bowel wall thickening or inflammatory changes. Vascular/Lymphatic: Aortic atherosclerosis. No enlarged abdominal or pelvic lymph nodes. Reproductive: Prostatomegaly. Prostate gland is closely associated with the bladder wall mass, better seen on previous CT. Other: No free fluid. No abdominopelvic fluid collection. No pneumoperitoneum. Small fat containing inguinal hernias, right greater than left. Musculoskeletal: Compression fractures of the T8 L3, and L4 vertebral bodies are unchanged from prior.  Bones are demineralized. No lytic or sclerotic bony lesion is evident. Mild anasarca. IMPRESSION: 1. Urinary bladder decompressed by Foley catheter. Large lobulated bladder wall mass, better seen on previous CT from 05/30/2022. 2. Moderate-severe left hydroureteronephrosis similar to the previous CT. Resolved right hydronephrosis with mild right hydroureter, improved. Stable bilateral renal calculi. 3. Prostatomegaly. Prostate gland is closely associated with the bladder wall mass, better seen on previous CT. 4. Large hiatal hernia with intrathoracic stomach. Gastric volvulus without evidence of gastric outlet obstruction. 5. Trace bilateral pleural effusions with bibasilar atelectasis. There is a degree of ground-glass opacity and interlobular septal thickening within the right perihilar and right basilar region, which could represent mild edema versus an infectious or inflammatory process. 6. Extensive colonic diverticulosis without evidence of acute diverticulitis. 7. Aortic Atherosclerosis (ICD10-I70.0). Electronically Signed   By: Davina Poke D.O.   On: 07/17/2022 15:08   IR US CHEST  Result Date: 07/16/2022 CLINICAL DATA:  Concern for pleural effusion EXAM: CHEST ULTRASOUND COMPARISON:  CT chest previous day FINDINGS: Focused sonographic exam of the chest was performed for fluid assessment. Images demonstrate no appreciable pleural fluid.  No thoracentesis performed. IMPRESSION: No appreciable pleural effusion bilaterally. No intervention performed. Electronically Signed   By: Albin Felling M.D.   On: 07/16/2022 13:56   US RENAL  Result Date: 07/15/2022 CLINICAL DATA:  Acute kidney injury EXAM: RENAL / URINARY TRACT ULTRASOUND COMPLETE COMPARISON:  None Available. FINDINGS: Right Kidney: Renal measurements: 10.6 x 5.4 x 5.2 cm = volume: 156 mL. 2 x 3 cm right lower pole renal cyst. Echogenicity within normal limits. No mass or hydronephrosis visualized. Left Kidney: Renal measurements: 10.00 x 4.8  x 5.0 cm = volume: 123 mL. Moderate hydronephrosis. 13 mm left lower pole calculus. Bladder: Empty bladder with Foley catheter inflated in the bladder. Other: None. IMPRESSION: Negative right kidney Moderate left hydronephrosis. 13 mm nonobstructing calculus left lower pole. Electronically Signed   By: Franchot Gallo M.D.   On: 07/15/2022 13:58   CT CHEST WO CONTRAST  Result Date: 07/15/2022 CLINICAL DATA:  Follow-up pleural effusions EXAM: CT CHEST WITHOUT CONTRAST TECHNIQUE: Multidetector CT imaging of the chest was performed following the standard protocol without IV contrast. RADIATION DOSE REDUCTION: This exam was performed according to the departmental dose-optimization program which includes automated exposure control, adjustment of the mA and/or kV according to patient size and/or use of iterative reconstruction technique. COMPARISON:  Chest x-ray from the previous day, CT from 05/30/2022 FINDINGS: Cardiovascular: Somewhat limited due to lack of IV contrast. Diffuse atherosclerotic calcifications are noted of the aorta. No aneurysmal dilatation or dissection is seen. No cardiac enlargement is noted. Coronary calcifications are seen. Mediastinum/Nodes: Thoracic inlet is within normal limits. No hilar or mediastinal adenopathy is noted. The esophagus is within normal limits. Large hiatal hernia is noted with the majority of the stomach within the left chest cavity as well as a loop of transverse colon. This is stable from the prior exam. Lungs/Pleura: Small right pleural effusion is noted. Right middle lobe consolidation is noted with volume loss new from the prior CT but stable from recent chest x-ray. Moderate-sized left pleural effusion is noted with left lower lobe consolidation. This has progressed in the interval from the prior exam. Some upper lobe atelectasis is noted as well. Upper Abdomen: Visualized upper abdomen shows no acute abnormality. Large hiatal hernia is again noted. Musculoskeletal:  Degenerative changes of the thoracic spine are noted. No rib abnormality is seen. Stable compression deformities involving T6 through T8 as well as T1. IMPRESSION: Right middle and left lower lobe consolidation progressed in the interval from the prior exam. Some left upper lobe atelectasis is noted as well. Bilateral pleural effusions slightly increased from the prior CT. Large hiatal hernia with the stomach and a portion of the transverse colon within the chest Aortic Atherosclerosis (ICD10-I70.0). Electronically Signed   By: Inez Catalina M.D.   On: 07/15/2022 02:01   DG Chest Portable 1 View  Result Date: 07/09/2022 CLINICAL DATA:  Shortness of breath. EXAM: PORTABLE CHEST 1 VIEW COMPARISON:  Chest radiograph and chest CT dated 05/30/2022. FINDINGS: The heart size is normal. Vascular calcifications are seen in the aortic arch. The stomach is again noted in the left thorax. There is a right pleural effusion with associated atelectasis/airspace disease. There is left basilar atelectasis/airspace disease. A left pleural effusion may contribute. There is no pneumothorax. Degenerative changes are seen in the spine. IMPRESSION: 1. Right pleural effusion with associated atelectasis/airspace disease. 2. Left basilar atelectasis/airspace disease and possible left pleural effusion. Aortic Atherosclerosis (ICD10-I70.0). Electronically Signed   By: Zerita Boers M.D.   On: 07/03/2022  18:57    Microbiology: Recent Results (from the past 240 hour(s))  Blood culture (routine x 2)     Status: None   Collection Time: 07/06/2022  6:14 PM   Specimen: BLOOD RIGHT HAND  Result Value Ref Range Status   Specimen Description BLOOD RIGHT HAND  Final   Special Requests   Final    BOTTLES DRAWN AEROBIC AND ANAEROBIC Blood Culture adequate volume   Culture   Final    NO GROWTH 5 DAYS Performed at [facility], [address], [place], [place] [address]    Report Status 07/19/2022 FINAL  Final  Urine Culture      Status: None   Collection Time: 06/30/2022  6:25 PM   Specimen: Urine, Clean Catch  Result Value Ref Range Status   Specimen Description URINE, CLEAN CATCH  Final   Special Requests NONE  Final   Culture   Final    NO GROWTH Performed at [facility], [place] [address], [place], [place] [address]    Report Status 07/16/2022 FINAL  Final  MRSA Next Gen by PCR, Nasal     Status: None   Collection Time: 07/19/2022 11:02 PM   Specimen: Nasal Mucosa; Nasal Swab  Result Value Ref Range Status   MRSA by PCR Next Gen NOT DETECTED NOT DETECTED Final    Comment: (NOTE) The GeneXpert MRSA Assay (FDA approved for NASAL specimens only), is one component of a comprehensive MRSA colonization surveillance program. It is not intended to diagnose MRSA infection nor to guide or monitor treatment for MRSA infections. Test performance is not FDA approved in patients less than 68 years old. Performed at [facility], [place] [address] [place], [place] [address]   Blood culture (routine x 2)     Status: None   Collection Time: 07/12/2022 11:19 PM   Specimen: BLOOD  Result Value Ref Range Status   Specimen Description BLOOD LEFT ANTECUBITAL  Final   Special Requests   Final    BOTTLES DRAWN AEROBIC AND ANAEROBIC Blood Culture adequate volume   Culture   Final    NO GROWTH 5 DAYS Performed at [facility], [place] [address], [place], [place] [address]    Report Status 07/20/2022 FINAL  Final  Culture, Urine (Do not remove urinary catheter, catheter placed by urology or difficult to place)     Status: None   Collection Time: 07/21/22 10:53 AM   Specimen: Urine, Catheterized  Result Value Ref Range Status   Specimen Description URINE, CATHETERIZED  Final   Special Requests NONE  Final   Culture   Final    NO GROWTH Performed at [place] [address], [place], [place] [address]    Report Status 07/22/2022 FINAL  Final     Labs: Basic Metabolic Panel: Recent Labs  Lab  07/18/22 0537 07/19/22 0223 07/20/22 0157 07/21/22 0747  NA 139 140 142 142  K 3.3* 3.2* 3.8 3.4*  CL 104 102 102 100  CO2 29 31 32 31  GLUCOSE 95 101* 104* 64*  BUN 26* '22 20 18  '$ CREATININE 1.14 1.12 1.13 1.20  CALCIUM 8.3* 8.5* 8.8* 8.8*  MG 1.8  --   --   --   PHOS 2.3*  --   --   --    Liver Function Tests: Recent Labs  Lab 07/18/22 0537 07/19/22 0223 07/20/22 0157 07/21/22 0747  AST  --  13* 16 19  ALT  --  $'10 11 9  'n$ ALKPHOS  --  69 71 81  BILITOT  --  0.5 0.9 1.0  PROT  --  5.3* 5.5* 5.6*  ALBUMIN 2.4* 2.6* 2.8* 3.0*   No results for input(s): "LIPASE", "AMYLASE" in the last 168 hours. No results for input(s): "AMMONIA" in the last 168 hours. CBC: Recent Labs  Lab 07/18/22 0537 07/19/22 0223 07/20/22 0157 07/21/22 0747  WBC 11.4* 12.7* 15.3* 26.4*  HGB 7.9* 8.1* 8.3* 8.6*  HCT 24.1* 25.3* 25.7* 27.5*  MCV 92.0 93.0 92.1 93.9  PLT 241 266 259 250   Cardiac Enzymes: No results for input(s): "CKTOTAL", "CKMB", "CKMBINDEX", "TROPONINI" in the last 168 hours. D-Dimer No results for input(s): "DDIMER" in the last 72 hours. BNP: Invalid input(s): "POCBNP" CBG: No results for input(s): "GLUCAP" in the last 168 hours. Anemia work up No results for input(s): "VITAMINB12", "FOLATE", "FERRITIN", "TIBC", "IRON", "RETICCTPCT" in the last 72 hours. Urinalysis    Component Value Date/Time   COLORURINE YELLOW 07/09/2022 1825   APPEARANCEUR HAZY (A) 07/10/2022 1825   LABSPEC 1.012 07/07/2022 1825   PHURINE 5.0 07/11/2022 1825   GLUCOSEU NEGATIVE 07/08/2022 1825   HGBUR LARGE (A) 07/07/2022 1825   BILIRUBINUR NEGATIVE 07/04/2022 1825   KETONESUR NEGATIVE 07/19/2022 1825   PROTEINUR 100 (A) 07/11/2022 1825   UROBILINOGEN 1.0 10/27/2012 1111   NITRITE NEGATIVE 07/21/2022 1825   LEUKOCYTESUR LARGE (A) 07/02/2022 1825   Sepsis Labs Recent Labs  Lab 07/18/22 0537 07/19/22 0223 07/20/22 0157 07/21/22 0747  WBC 11.4* 12.7* 15.3* 26.4*    SIGNED:  Marylu Lund, MD  [facility] 2022/08/01, 3:52 PM  If 7PM-7AM, please contact night-coverage [email or web address] Password [email or web address]

## 2022-07-29 NOTE — Progress Notes (Signed)
Time of death 0600. Pronounced by two RN's.

## 2022-07-29 NOTE — Progress Notes (Signed)
   08-16-22 0600  Attending [place]  Attending Physician Notified Y  Attending Physician (First and Last Name) Inda Merlin MD  Post Mortem Checklist  Date of Death 08/16/2022  Time of Death 0600  Pronounced By Janyth Pupa RN (Aeven Dela Verdon Cummins RN)  Next of kin notified Yes  Name of next of kin notified of death [place] Person's Relationship to Patient Son  Was the patient a No Code Blue or a Limited Code Blue? No  Did the patient die unattended? No  Patient restrained? Not applicable  [facility] (previously known as [facility])  Notification Date 2022-08-16  Notification Time [id number]  [facility] Number [phone] Conard Novak Rick Duff)  Is patient a potential donor? N  Autopsy  Autopsy requested by MD or Family ( Non ME Case) N/A  Patient and [place] Returned  Patient is satisfied that all belongings have been returned? Not applicable  Dead on Arrival (Emergency Department)  Patient dead on arrival? No  Medical Examiner  Is this a medical examiner's case? N

## 2022-07-29 NOTE — Progress Notes (Signed)
Called in by son to check on patient. " I think he passed." Another RN to check on patient. Patient has expired. No heartbeat or respiration noted. Skin cool and dry. Son at bedside calling family member to come to see patient. Will update MD

## 2022-07-29 DEATH — deceased
